# Patient Record
Sex: Female | Born: 1970 | Race: Black or African American | Hispanic: No | Marital: Single | Smoking: Never smoker
Health system: Southern US, Community
[De-identification: ages and names within clinical notes are randomized; demographics above are authoritative.]

## PROBLEM LIST (undated history)

## (undated) DIAGNOSIS — G47411 Narcolepsy with cataplexy: Secondary | ICD-10-CM

## (undated) DIAGNOSIS — Z1371 Encounter for nonprocreative screening for genetic disease carrier status: Secondary | ICD-10-CM

## (undated) DIAGNOSIS — G909 Disorder of the autonomic nervous system, unspecified: Secondary | ICD-10-CM

## (undated) DIAGNOSIS — E668 Other obesity: Secondary | ICD-10-CM

## (undated) DIAGNOSIS — Z9189 Other specified personal risk factors, not elsewhere classified: Secondary | ICD-10-CM

## (undated) DIAGNOSIS — R7303 Prediabetes: Secondary | ICD-10-CM

## (undated) DIAGNOSIS — G4733 Obstructive sleep apnea (adult) (pediatric): Secondary | ICD-10-CM

## (undated) DIAGNOSIS — J45909 Unspecified asthma, uncomplicated: Secondary | ICD-10-CM

## (undated) DIAGNOSIS — F32A Depression, unspecified: Secondary | ICD-10-CM

## (undated) DIAGNOSIS — J4540 Moderate persistent asthma, uncomplicated: Secondary | ICD-10-CM

## (undated) DIAGNOSIS — F329 Major depressive disorder, single episode, unspecified: Secondary | ICD-10-CM

## (undated) DIAGNOSIS — Z803 Family history of malignant neoplasm of breast: Secondary | ICD-10-CM

## (undated) DIAGNOSIS — B009 Herpesviral infection, unspecified: Secondary | ICD-10-CM

## (undated) HISTORY — DX: Family history of malignant neoplasm of breast: Z80.3

## (undated) HISTORY — DX: Other obesity: E66.8

## (undated) HISTORY — PX: TONSILECTOMY, ADENOIDECTOMY, BILATERAL MYRINGOTOMY AND TUBES: SHX2538

## (undated) HISTORY — DX: Depression, unspecified: F32.A

## (undated) HISTORY — PX: TUBAL LIGATION: SHX77

## (undated) HISTORY — PX: TONSILLECTOMY: SUR1361

## (undated) HISTORY — DX: Disorder of the autonomic nervous system, unspecified: G90.9

## (undated) HISTORY — DX: Narcolepsy with cataplexy: G47.411

## (undated) HISTORY — DX: Moderate persistent asthma, uncomplicated: J45.40

## (undated) HISTORY — DX: Major depressive disorder, single episode, unspecified: F32.9

## (undated) HISTORY — DX: Encounter for nonprocreative screening for genetic disease carrier status: Z13.71

## (undated) HISTORY — DX: Unspecified asthma, uncomplicated: J45.909

## (undated) HISTORY — DX: Herpesviral infection, unspecified: B00.9

## (undated) HISTORY — DX: Obstructive sleep apnea (adult) (pediatric): G47.33

## (undated) HISTORY — DX: Other specified personal risk factors, not elsewhere classified: Z91.89

---

## 2008-01-18 ENCOUNTER — Ambulatory Visit: Payer: Self-pay | Admitting: Unknown Physician Specialty

## 2009-03-18 ENCOUNTER — Emergency Department: Payer: Self-pay | Admitting: Emergency Medicine

## 2009-09-27 HISTORY — PX: BREAST BIOPSY: SHX20

## 2010-08-03 ENCOUNTER — Ambulatory Visit: Payer: Self-pay | Admitting: Unknown Physician Specialty

## 2010-08-19 ENCOUNTER — Ambulatory Visit: Payer: Self-pay | Admitting: Unknown Physician Specialty

## 2010-09-17 ENCOUNTER — Ambulatory Visit: Payer: Self-pay | Admitting: Surgery

## 2011-04-15 ENCOUNTER — Ambulatory Visit: Payer: Self-pay | Admitting: Internal Medicine

## 2011-04-16 ENCOUNTER — Ambulatory Visit: Payer: Self-pay | Admitting: Internal Medicine

## 2011-04-22 DIAGNOSIS — R739 Hyperglycemia, unspecified: Secondary | ICD-10-CM | POA: Insufficient documentation

## 2011-04-22 DIAGNOSIS — J4540 Moderate persistent asthma, uncomplicated: Secondary | ICD-10-CM

## 2011-04-22 DIAGNOSIS — E668 Other obesity: Secondary | ICD-10-CM

## 2011-04-22 DIAGNOSIS — E669 Obesity, unspecified: Secondary | ICD-10-CM

## 2011-04-22 HISTORY — DX: Moderate persistent asthma, uncomplicated: J45.40

## 2011-04-22 HISTORY — DX: Other obesity: E66.8

## 2011-04-22 HISTORY — DX: Obesity, unspecified: E66.9

## 2011-05-27 ENCOUNTER — Ambulatory Visit: Payer: Self-pay | Admitting: Obstetrics & Gynecology

## 2011-07-23 ENCOUNTER — Ambulatory Visit: Payer: Self-pay | Admitting: Obstetrics & Gynecology

## 2011-07-30 ENCOUNTER — Ambulatory Visit: Payer: Self-pay | Admitting: Obstetrics & Gynecology

## 2011-08-23 ENCOUNTER — Ambulatory Visit: Payer: Self-pay | Admitting: Unknown Physician Specialty

## 2011-09-28 DIAGNOSIS — Z9189 Other specified personal risk factors, not elsewhere classified: Secondary | ICD-10-CM

## 2011-09-28 DIAGNOSIS — Z1371 Encounter for nonprocreative screening for genetic disease carrier status: Secondary | ICD-10-CM

## 2011-09-28 HISTORY — DX: Other specified personal risk factors, not elsewhere classified: Z91.89

## 2011-09-28 HISTORY — DX: Encounter for nonprocreative screening for genetic disease carrier status: Z13.71

## 2012-08-07 ENCOUNTER — Ambulatory Visit: Payer: Self-pay | Admitting: Unknown Physician Specialty

## 2012-11-02 ENCOUNTER — Emergency Department: Payer: Self-pay | Admitting: Emergency Medicine

## 2013-01-02 DIAGNOSIS — M545 Low back pain, unspecified: Secondary | ICD-10-CM | POA: Insufficient documentation

## 2013-01-27 DIAGNOSIS — F32A Depression, unspecified: Secondary | ICD-10-CM | POA: Insufficient documentation

## 2013-01-27 DIAGNOSIS — G47411 Narcolepsy with cataplexy: Secondary | ICD-10-CM

## 2013-01-27 DIAGNOSIS — F329 Major depressive disorder, single episode, unspecified: Secondary | ICD-10-CM | POA: Insufficient documentation

## 2013-01-27 HISTORY — DX: Narcolepsy with cataplexy: G47.411

## 2013-01-27 HISTORY — DX: Depression, unspecified: F32.A

## 2013-02-09 ENCOUNTER — Encounter: Payer: Self-pay | Admitting: Orthopaedic Surgery

## 2013-02-13 ENCOUNTER — Ambulatory Visit: Payer: Self-pay | Admitting: Family Medicine

## 2013-02-25 ENCOUNTER — Encounter: Payer: Self-pay | Admitting: Orthopaedic Surgery

## 2013-03-13 ENCOUNTER — Other Ambulatory Visit: Payer: Self-pay | Admitting: *Deleted

## 2013-03-13 DIAGNOSIS — Z1239 Encounter for other screening for malignant neoplasm of breast: Secondary | ICD-10-CM

## 2013-03-15 ENCOUNTER — Ambulatory Visit: Payer: Self-pay | Admitting: Family Medicine

## 2013-03-19 ENCOUNTER — Other Ambulatory Visit: Payer: Self-pay | Admitting: Obstetrics & Gynecology

## 2013-03-19 DIAGNOSIS — Z803 Family history of malignant neoplasm of breast: Secondary | ICD-10-CM

## 2013-03-19 DIAGNOSIS — Z1239 Encounter for other screening for malignant neoplasm of breast: Secondary | ICD-10-CM

## 2013-03-29 ENCOUNTER — Ambulatory Visit
Admission: RE | Admit: 2013-03-29 | Discharge: 2013-03-29 | Disposition: A | Discharge status: Home / Self Care | Payer: BC Managed Care – PPO | Source: Ambulatory Visit | Attending: Obstetrics & Gynecology | Admitting: Obstetrics & Gynecology

## 2013-03-29 DIAGNOSIS — Z803 Family history of malignant neoplasm of breast: Secondary | ICD-10-CM

## 2013-03-29 DIAGNOSIS — Z1239 Encounter for other screening for malignant neoplasm of breast: Secondary | ICD-10-CM

## 2013-05-07 ENCOUNTER — Other Ambulatory Visit: Payer: BC Managed Care – PPO

## 2013-05-09 ENCOUNTER — Ambulatory Visit
Admission: RE | Admit: 2013-05-09 | Discharge: 2013-05-09 | Disposition: A | Discharge status: Home / Self Care | Payer: BC Managed Care – PPO | Source: Ambulatory Visit | Attending: *Deleted | Admitting: *Deleted

## 2013-05-09 DIAGNOSIS — Z1239 Encounter for other screening for malignant neoplasm of breast: Secondary | ICD-10-CM

## 2013-05-09 MED ORDER — GADOBENATE DIMEGLUMINE 529 MG/ML IV SOLN
20.0000 mL | Freq: Once | INTRAVENOUS | Status: AC | PRN
  Administered 2013-05-09: 20 mL via INTRAVENOUS

## 2013-11-01 DIAGNOSIS — M1991 Primary osteoarthritis, unspecified site: Secondary | ICD-10-CM | POA: Insufficient documentation

## 2014-04-26 ENCOUNTER — Other Ambulatory Visit: Payer: Self-pay

## 2014-04-26 DIAGNOSIS — Z1231 Encounter for screening mammogram for malignant neoplasm of breast: Secondary | ICD-10-CM

## 2014-05-09 ENCOUNTER — Ambulatory Visit: Payer: BC Managed Care – PPO

## 2014-05-10 ENCOUNTER — Other Ambulatory Visit: Payer: Self-pay | Admitting: *Deleted

## 2014-05-10 ENCOUNTER — Ambulatory Visit (INDEPENDENT_AMBULATORY_CARE_PROVIDER_SITE_OTHER): Payer: No Typology Code available for payment source

## 2014-05-10 ENCOUNTER — Ambulatory Visit (INDEPENDENT_AMBULATORY_CARE_PROVIDER_SITE_OTHER): Payer: No Typology Code available for payment source | Admitting: Podiatry

## 2014-05-10 ENCOUNTER — Encounter: Payer: Self-pay | Admitting: Podiatry

## 2014-05-10 VITALS — BP 122/79 | HR 80 | Resp 16 | Ht 67.0 in | Wt 350.0 lb

## 2014-05-10 DIAGNOSIS — M722 Plantar fascial fibromatosis: Secondary | ICD-10-CM

## 2014-05-10 MED ORDER — DICLOFENAC SODIUM 75 MG PO TBEC: 75.0000 mg | DELAYED_RELEASE_TABLET | Freq: Two times a day (BID) | ORAL | Status: AC

## 2014-05-10 MED ORDER — TRIAMCINOLONE ACETONIDE 10 MG/ML IJ SUSP
10.0000 mg | Freq: Once | INTRAMUSCULAR | Status: AC
  Administered 2014-05-10: 10 mg

## 2014-05-10 NOTE — Progress Notes (Signed)
Subjective:     Patient ID: Emma Bowman, female   DOB: 07-24-1971, 43 y.o.   MRN: [id number]  HPI patient presents stating I have a very painful left heel and I'm trying to lose weight and trying to exercise. States it's been hurting for around 6 months   Review of Systems     Objective:   Physical Exam Neurovascular status intact with muscle strength adequate and found to have exquisite discomfort left plantar heel at the insertion into the calcaneus    Assessment:     Plantar fasciitis left with inflammation and fluid buildup    Plan:     H&P and x-ray reviewed and injected the left plantar fascia 3 mg Kenalog 5 mg like Marcaine mixture and instructed on physical therapy dispensed fascially brace and placed on diclofenac 75 mg twice a day

## 2014-05-10 NOTE — Patient Instructions (Signed)

## 2014-05-23 ENCOUNTER — Ambulatory Visit: Payer: BC Managed Care – PPO

## 2014-05-24 ENCOUNTER — Ambulatory Visit: Payer: No Typology Code available for payment source | Admitting: Podiatry

## 2014-05-27 ENCOUNTER — Ambulatory Visit: Payer: BC Managed Care – PPO

## 2014-06-04 ENCOUNTER — Ambulatory Visit (INDEPENDENT_AMBULATORY_CARE_PROVIDER_SITE_OTHER): Payer: No Typology Code available for payment source | Admitting: Podiatry

## 2014-06-04 VITALS — BP 134/84 | HR 79 | Resp 16

## 2014-06-04 DIAGNOSIS — M722 Plantar fascial fibromatosis: Secondary | ICD-10-CM

## 2014-06-04 MED ORDER — TRIAMCINOLONE ACETONIDE 10 MG/ML IJ SUSP: 10.0000 mg | Freq: Once | INTRAMUSCULAR | Status: AC

## 2014-06-04 NOTE — Progress Notes (Signed)
Subjective:     Patient ID: Emma Bowman, female   DOB: Feb 14, 1971, 43 y.o.   MRN: [id number]  HPI patient states that my heel is still hurting me and I'm supposed to be active do to my weight but I'm having trouble and it is worse when I get up in the morning   Review of Systems     Objective:   Physical Exam Neurovascular status unchanged with pain more in the central and lateral band of the plantar fascial left with discomfort especially after periods of sitting    Assessment:     Plantar fasciitis still present left heel    Plan:     Reviewed condition and today from the lateral side I injected 3 mg Kenalog 5 mg Xylocaine and dispensed night splint with instructions on usage

## 2014-06-10 ENCOUNTER — Encounter (INDEPENDENT_AMBULATORY_CARE_PROVIDER_SITE_OTHER): Payer: Self-pay

## 2014-06-10 ENCOUNTER — Ambulatory Visit
Admission: RE | Admit: 2014-06-10 | Discharge: 2014-06-10 | Disposition: A | Discharge status: Home / Self Care | Payer: BC Managed Care – PPO | Source: Ambulatory Visit

## 2014-06-10 DIAGNOSIS — Z1231 Encounter for screening mammogram for malignant neoplasm of breast: Secondary | ICD-10-CM

## 2014-06-15 DIAGNOSIS — G4701 Insomnia due to medical condition: Secondary | ICD-10-CM | POA: Insufficient documentation

## 2014-06-15 DIAGNOSIS — R351 Nocturia: Secondary | ICD-10-CM | POA: Insufficient documentation

## 2014-06-15 DIAGNOSIS — G909 Disorder of the autonomic nervous system, unspecified: Secondary | ICD-10-CM

## 2014-06-15 DIAGNOSIS — G4710 Hypersomnia, unspecified: Secondary | ICD-10-CM | POA: Insufficient documentation

## 2014-06-15 HISTORY — DX: Disorder of the autonomic nervous system, unspecified: G90.9

## 2014-06-25 ENCOUNTER — Ambulatory Visit: Payer: No Typology Code available for payment source | Admitting: Podiatry

## 2014-08-13 DIAGNOSIS — G47419 Narcolepsy without cataplexy: Secondary | ICD-10-CM | POA: Insufficient documentation

## 2014-09-16 ENCOUNTER — Other Ambulatory Visit: Payer: Self-pay | Admitting: Obstetrics & Gynecology

## 2014-09-16 DIAGNOSIS — Z803 Family history of malignant neoplasm of breast: Secondary | ICD-10-CM

## 2014-09-16 DIAGNOSIS — R922 Inconclusive mammogram: Secondary | ICD-10-CM

## 2014-09-16 DIAGNOSIS — R9230 Dense breasts, unspecified: Secondary | ICD-10-CM

## 2014-10-22 ENCOUNTER — Other Ambulatory Visit: Payer: Self-pay | Admitting: Obstetrics & Gynecology

## 2014-10-22 DIAGNOSIS — Z803 Family history of malignant neoplasm of breast: Secondary | ICD-10-CM

## 2014-10-22 DIAGNOSIS — R922 Inconclusive mammogram: Secondary | ICD-10-CM

## 2015-01-02 DIAGNOSIS — N393 Stress incontinence (female) (male): Secondary | ICD-10-CM | POA: Insufficient documentation

## 2015-03-25 DIAGNOSIS — B009 Herpesviral infection, unspecified: Secondary | ICD-10-CM

## 2015-03-25 HISTORY — DX: Herpesviral infection, unspecified: B00.9

## 2015-06-09 ENCOUNTER — Other Ambulatory Visit: Payer: Self-pay

## 2015-06-09 DIAGNOSIS — Z1231 Encounter for screening mammogram for malignant neoplasm of breast: Secondary | ICD-10-CM

## 2015-07-11 DIAGNOSIS — G4733 Obstructive sleep apnea (adult) (pediatric): Secondary | ICD-10-CM

## 2015-07-11 HISTORY — DX: Obstructive sleep apnea (adult) (pediatric): G47.33

## 2015-07-14 ENCOUNTER — Ambulatory Visit: Payer: BC Managed Care – PPO

## 2015-08-05 ENCOUNTER — Ambulatory Visit
Admission: RE | Admit: 2015-08-05 | Discharge: 2015-08-05 | Disposition: A | Discharge status: Home / Self Care | Payer: BC Managed Care – PPO | Source: Ambulatory Visit

## 2015-08-05 DIAGNOSIS — Z1231 Encounter for screening mammogram for malignant neoplasm of breast: Secondary | ICD-10-CM

## 2015-08-12 ENCOUNTER — Other Ambulatory Visit: Payer: Self-pay | Admitting: Obstetrics & Gynecology

## 2015-08-12 DIAGNOSIS — N63 Unspecified lump in unspecified breast: Secondary | ICD-10-CM

## 2015-08-12 DIAGNOSIS — Z803 Family history of malignant neoplasm of breast: Secondary | ICD-10-CM

## 2015-08-14 ENCOUNTER — Ambulatory Visit: Payer: Self-pay | Admitting: Gastroenterology

## 2015-08-14 ENCOUNTER — Other Ambulatory Visit: Payer: Self-pay

## 2015-08-20 ENCOUNTER — Ambulatory Visit
Admission: RE | Admit: 2015-08-20 | Discharge: 2015-08-20 | Disposition: A | Discharge status: Home / Self Care | Payer: BC Managed Care – PPO | Source: Ambulatory Visit | Attending: Obstetrics & Gynecology | Admitting: Obstetrics & Gynecology

## 2015-08-20 ENCOUNTER — Other Ambulatory Visit: Payer: Self-pay | Admitting: Obstetrics & Gynecology

## 2015-08-20 DIAGNOSIS — N63 Unspecified lump in unspecified breast: Secondary | ICD-10-CM

## 2015-09-24 ENCOUNTER — Encounter: Payer: Self-pay | Admitting: Gastroenterology

## 2015-09-24 ENCOUNTER — Ambulatory Visit (INDEPENDENT_AMBULATORY_CARE_PROVIDER_SITE_OTHER): Payer: BC Managed Care – PPO | Admitting: Gastroenterology

## 2015-09-24 VITALS — BP 128/79 | HR 77 | Temp 97.9°F | Ht 67.0 in | Wt 335.0 lb

## 2015-09-24 DIAGNOSIS — K219 Gastro-esophageal reflux disease without esophagitis: Secondary | ICD-10-CM

## 2015-09-24 DIAGNOSIS — K5909 Other constipation: Secondary | ICD-10-CM | POA: Diagnosis not present

## 2015-09-24 NOTE — Progress Notes (Signed)
Gastroenterology Consultation  Referring Provider:     Hortencia Pilar, MD Primary Care Physician:  Hortencia Pilar, MD Primary Gastroenterologist:  Dr. Allen Norris     Reason for Consultation:     Constipation        HPI:   Emma Bowman is a 44 y.o. y/o female referred for consultation & management of constipation by Dr. Hortencia Pilar, MD.  This patient comes today with a report of constipation. The patient states that she has constipation on a regular basis and has to take medication for it. The patient reports that she takes MiraLAX and multiple over-the-counter medications for her constipation. She denies any unexplained weight loss. She also reports that she had a colonoscopy and EGD back in 2013 by Dr. Vira Agar. At that time it was done for the constipation and for heartburn. The patient had biopsies of the esophagus that was consistent with reflux. The patient reports that her omeprazole 40 mg a day is not taking care of her heartburn. The patient also reports that the constipation is her pain in her lower back. No report of any black stools or bloody stools. The pathology from her last procedure showed some mild gastritis reflux but did not have any pathology from the colon to suggest any colon polyps. The patient also states that she does not believe that there were any polyps found during her colonoscopy.  Past Medical History  Diagnosis Date  . Asthma   . Depression   . Asthma, moderate persistent 04/22/2011  . Obstructive apnea 07/11/2015  . ANS (autonomic nervous system) disease 06/15/2014    Overview:  Please refer to the documentation related to her overnight sleep procedure on  04/24/2014 and at the [facility]  [id number]): -see detailed sleep report illustrating the contribution of her nighttime variability in heart rate to the fragmentation of her sleep process - we recommend a trial of auto-PAP to control the mild OSA, mostly REM-sleep dependent prior to consi  .  Clinical depression 01/27/2013  . Extreme obesity ([place]) 04/22/2011  . 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 Herpes simplex type 1 infection 03/25/2015    Past Surgical History  Procedure Laterality Date  . Tubal ligation    . Tonsilectomy, adenoidectomy, bilateral myringotomy and tubes      Prior to Admission medications   Medication Sig Start Date End Date Taking? Authorizing Provider  albuterol (PROVENTIL) (2.5 MG/3ML) 0.083% nebulizer solution Inhale into the lungs. 02/13/13  Yes Historical Provider, MD  antipyrine-benzocaine Toniann Fail) otic solution  02/15/14  Yes Historical Provider, MD  beclomethasone (QVAR) 80 MCG/ACT inhaler  11/06/13  Yes Historical Provider, MD  chlorhexidine (PERIDEX) 0.12 % solution SWISH AND SPIT 15 MLS 2 (TWO) TIMES DAILY. 07/24/15  Yes Historical Provider, MD  CONTRAVE 8-90 MG TB12  08/11/15  Yes Historical Provider, MD  diclofenac (VOLTAREN) 75 MG EC tablet Take 1 tablet (75 mg total) by mouth 2 (two) times daily. 05/10/14  Yes Wallene Huh, DPM  diclofenac sodium (VOLTAREN) 1 % GEL  10/31/13  Yes Historical Provider, MD  modafinil (PROVIGIL) 200 MG tablet Take by mouth. 07/11/15 07/10/16 Yes Historical Provider, MD  montelukast (SINGULAIR) 10 MG tablet TAKE 1 TABLET (10 MG TOTAL) BY MOUTH NIGHTLY. 07/15/15  Yes Historical Provider, MD  neomycin-polymyxin-hydrocortisone (CORTISPORIN) 3.5-10000-1 otic suspension PLACE 3 DROPS INTO BOTH EARS 4 (FOUR) TIMES DAILY. USE IN AFFECTED EAR. 05/29/15  Yes Historical Provider, MD  nystatin (MYCOSTATIN) 100000 UNIT/ML suspension SWISH AND SWALLOW 5 MLS  4 (FOUR) TIMES DAILY. 07/28/15 07/01/16 Yes Historical Provider, MD  omeprazole (PRILOSEC) 40 MG capsule  11/22/13  Yes Historical Provider, MD  traMADol (ULTRAM) 50 MG tablet  11/05/13  Yes Historical Provider, MD  amphetamine-dextroamphetamine (ADDERALL XR) 30 MG 24 hr capsule Reported on 09/24/2015 11/29/13   Historical Provider, MD  DULoxetine (CYMBALTA) 30 MG capsule Take by mouth.  03/22/14 03/22/15  Historical Provider, MD  fluticasone Asencion Islam) 50 MCG/ACT nasal spray Reported on 09/24/2015 11/29/13   Historical Provider, MD  meloxicam (MOBIC) 15 MG tablet Reported on 09/24/2015 11/29/13   Historical Provider, MD  montelukast (SINGULAIR) 10 MG tablet Take by mouth. 02/15/14 02/15/15  Historical Provider, MD  oxybutynin (DITROPAN) 5 MG tablet Take by mouth. Reported on 09/24/2015 01/01/15 01/01/16  Historical Provider, MD  traZODone (DESYREL) 100 MG tablet Take by mouth. 05/09/14 05/10/15  Historical Provider, MD    Family History  Problem Relation Age of Onset  . Diabetes Mother   . Diverticulitis Father      Social History  Substance Use Topics  . Smoking status: Never Smoker   . Smokeless tobacco: Never Used  . Alcohol Use: 0.0 oz/week    0 Standard drinks or equivalent per week    Allergies as of 09/24/2015  . (No Known Allergies)    Review of Systems:    All systems reviewed and negative except where noted in HPI.   Physical Exam:  BP 128/79 mmHg  Pulse 77  Temp(Src) 97.9 F (36.6 C) (Oral)  Ht 5\' 7"  (1.702 m)  Wt 335 lb (151.955 kg)  BMI 52.46 kg/m2 No LMP recorded. Psych:  Alert and cooperative. Normal mood and affect. General:   Alert,  Well-developed, morbidly obese, well-nourished, pleasant and cooperative in NAD Head:  Normocephalic and atraumatic. Eyes:  Sclera clear, no icterus.   Conjunctiva pink. Ears:  Normal auditory acuity. Nose:  No deformity, discharge, or lesions. Mouth:  No deformity or lesions,oropharynx pink & moist. Neck:  Supple; no masses or thyromegaly. Lungs:  Respirations even and unlabored.  Clear throughout to auscultation.   No wheezes, crackles, or rhonchi. No acute distress. Heart:  Regular rate and rhythm; no murmurs, clicks, rubs, or gallops. Abdomen:  Normal bowel sounds.  No bruits.  Soft, non-tender anddistended without masses, hepatosplenomegaly or hernias noted.  No guarding or rebound tenderness.  Negative Carnett  sign.   Rectal:  Deferred.  Msk:  Symmetrical without gross deformities.  Good, equal movement & strength bilaterally. Pulses:  Normal pulses noted. Extremities:  No clubbing or edema.  No cyanosis. Neurologic:  Alert and oriented x3;  grossly normal neurologically. Skin:  Intact without significant lesions or rashes.  No jaundice. Lymph Nodes:  No significant cervical adenopathy. Psych:  Alert and cooperative. Normal mood and affect.  Imaging Studies: No results found.  Assessment and Plan:   Emma Bowman is a 44 y.o. y/o female comes today with a history of chronic constipation. She also reports that she is having reflux that has not helped with her present omeprazole treatment. The patient will be started on 145 mg of Linzess and will also be started on a trial of Dexilant 60 mg per day. The patient will contact me with the results of these medications and she will then be given a prescription for the medication if they work. The patient has been explained the plan and agrees with it.   Note: This dictation was prepared with Dragon dictation along with smaller phrase technology. Any transcriptional errors that  result from this process are unintentional.

## 2016-09-30 ENCOUNTER — Other Ambulatory Visit: Payer: Self-pay | Admitting: Family Medicine

## 2016-09-30 DIAGNOSIS — Z1231 Encounter for screening mammogram for malignant neoplasm of breast: Secondary | ICD-10-CM

## 2016-10-01 ENCOUNTER — Ambulatory Visit
Admission: RE | Admit: 2016-10-01 | Discharge: 2016-10-01 | Disposition: A | Discharge status: Home / Self Care | Payer: BC Managed Care – PPO | Source: Ambulatory Visit | Attending: Family Medicine | Admitting: Family Medicine

## 2016-10-01 DIAGNOSIS — Z1231 Encounter for screening mammogram for malignant neoplasm of breast: Secondary | ICD-10-CM

## 2017-01-12 ENCOUNTER — Other Ambulatory Visit: Payer: No Typology Code available for payment source

## 2017-01-12 ENCOUNTER — Ambulatory Visit: Payer: No Typology Code available for payment source | Admitting: Obstetrics & Gynecology

## 2017-02-04 ENCOUNTER — Other Ambulatory Visit: Payer: Self-pay | Admitting: Obstetrics & Gynecology

## 2017-02-04 DIAGNOSIS — D259 Leiomyoma of uterus, unspecified: Secondary | ICD-10-CM

## 2017-02-08 ENCOUNTER — Ambulatory Visit (INDEPENDENT_AMBULATORY_CARE_PROVIDER_SITE_OTHER): Payer: BC Managed Care – PPO

## 2017-02-08 ENCOUNTER — Encounter: Payer: Self-pay | Admitting: Obstetrics & Gynecology

## 2017-02-08 ENCOUNTER — Ambulatory Visit (INDEPENDENT_AMBULATORY_CARE_PROVIDER_SITE_OTHER): Payer: BC Managed Care – PPO | Admitting: Obstetrics & Gynecology

## 2017-02-08 VITALS — BP 130/90 | HR 76 | Ht 67.0 in | Wt 350.0 lb

## 2017-02-08 DIAGNOSIS — D259 Leiomyoma of uterus, unspecified: Secondary | ICD-10-CM

## 2017-02-08 DIAGNOSIS — R1032 Left lower quadrant pain: Secondary | ICD-10-CM | POA: Diagnosis not present

## 2017-02-08 NOTE — Progress Notes (Signed)
  HPI: Pt returns with left sided pain, mild and intermitant.  [place]  3 mos ago w RIGHT sided mass (tube or uterus fibroid, unclear origin).  Ultrasound demonstrates no masses seen These findings are Pelvis resolved mass on right side, so not a fibroid.  No new lesions.  PMHx: She  has a past medical history of ANS (autonomic nervous system) disease (06/15/2014); Asthma; Asthma, moderate persistent (04/22/2011); Cataplexy and narcolepsy (01/27/2013); Clinical depression (01/27/2013); Depression; Extreme obesity (04/22/2011); Herpes simplex type 1 infection (03/25/2015); and Obstructive apnea (07/11/2015). Also,  has a past surgical history that includes Tubal ligation and Tonsilectomy, adenoidectomy, bilateral myringotomy and tubes., family history includes Diabetes in her mother; Diverticulitis in her father.,  reports that she has never smoked. She has never used smokeless tobacco. She reports that she drinks alcohol. She reports that she does not use drugs.  She has a current medication list which includes the following prescription(s): albuterol, beclomethasone, chlorhexidine, diclofenac, fluticasone, ipratropium, omeprazole, tramadol, amphetamine-dextroamphetamine, antipyrine-benzocaine, contrave, diclofenac sodium, duloxetine, meloxicam, modafinil, montelukast, montelukast, neomycin-polymyxin-hydrocortisone, trazodone, triamcinolone, and vitamin d (ergocalciferol), and the following Facility-Administered Medications: triamcinolone acetonide. Also, has No Known Allergies.  Review of Systems  Constitutional: Negative for chills, fever and malaise/fatigue.  HENT: Negative for congestion, sinus pain and sore throat.   Eyes: Negative for blurred vision and pain.  Respiratory: Negative for cough and wheezing.   Cardiovascular: Negative for chest pain and leg swelling.  Gastrointestinal: Negative for abdominal pain, constipation, diarrhea, heartburn, nausea and vomiting.  Genitourinary: Negative for dysuria,  frequency, hematuria and urgency.  Musculoskeletal: Negative for back pain, joint pain, myalgias and neck pain.  Skin: Negative for itching and rash.  Neurological: Negative for dizziness, tremors and weakness.  Endo/Heme/Allergies: Does not bruise/bleed easily.  Psychiatric/Behavioral: Negative for depression. The patient is not nervous/anxious and does not have insomnia.     Objective: BP 130/90   Pulse 76   Ht 5\' 7"  (1.702 m)   Wt (!) 350 lb (158.8 kg)   LMP 01/27/2017   BMI 54.82 kg/m   Physical examination Constitutional NAD, Conversant  Skin No rashes, lesions or ulceration.   Extremities: Moves all appropriately.  Normal ROM for age. No lymphadenopathy.  Neuro: Grossly intact  Psych: Oriented to PPT.  Normal mood. Normal affect.   Assessment:  Left lower quadrant pain GI f/u as scheduled No further GYN interventions.  Do not think Dx Lap will help ellucidate pain etiology without risk.  Spine MRI also a possibility. Pain clinic.  Pain mild and she does not wish to aggressively pursue at this time.  Barnett Applebaum, MD, [facility], [facility] 02/08/2017  5:11 PM

## 2017-04-27 ENCOUNTER — Encounter: Payer: Self-pay | Admitting: *Deleted

## 2017-04-27 ENCOUNTER — Emergency Department: Payer: BC Managed Care – PPO

## 2017-04-27 ENCOUNTER — Emergency Department
Admission: EM | Admit: 2017-04-27 | Discharge: 2017-04-27 | Disposition: A | Discharge status: Home / Self Care | Payer: BC Managed Care – PPO | Attending: Emergency Medicine | Admitting: Emergency Medicine

## 2017-04-27 DIAGNOSIS — R55 Syncope and collapse: Secondary | ICD-10-CM | POA: Diagnosis not present

## 2017-04-27 DIAGNOSIS — R51 Headache: Secondary | ICD-10-CM | POA: Diagnosis present

## 2017-04-27 DIAGNOSIS — G44209 Tension-type headache, unspecified, not intractable: Secondary | ICD-10-CM | POA: Insufficient documentation

## 2017-04-27 DIAGNOSIS — R1084 Generalized abdominal pain: Secondary | ICD-10-CM | POA: Insufficient documentation

## 2017-04-27 DIAGNOSIS — J45909 Unspecified asthma, uncomplicated: Secondary | ICD-10-CM | POA: Insufficient documentation

## 2017-04-27 LAB — COMPREHENSIVE METABOLIC PANEL
ALK PHOS: 36 U/L — AB (ref 38–126)
ALT: 13 U/L — AB (ref 14–54)
AST: 17 U/L (ref 15–41)
Albumin: 3.7 g/dL (ref 3.5–5.0)
Anion gap: 7 (ref 5–15)
BILIRUBIN TOTAL: 0.7 mg/dL (ref 0.3–1.2)
BUN: 12 mg/dL (ref 6–20)
CHLORIDE: 105 mmol/L (ref 101–111)
CO2: 26 mmol/L (ref 22–32)
CREATININE: 0.72 mg/dL (ref 0.44–1.00)
Calcium: 8.8 mg/dL — ABNORMAL LOW (ref 8.9–10.3)
GFR calc Af Amer: >60 mL/min (ref >60)
Glucose, Bld: 102 mg/dL — ABNORMAL HIGH (ref 65–99)
Potassium: 3.8 mmol/L (ref 3.5–5.1)
Sodium: 138 mmol/L (ref 135–145)
Total Protein: 6.6 g/dL (ref 6.5–8.1)

## 2017-04-27 LAB — CBC
HCT: 39.3 % (ref 35.0–47.0)
Hemoglobin: 13.1 g/dL (ref 12.0–16.0)
MCH: 29.7 pg (ref 26.0–34.0)
MCHC: 33.3 g/dL (ref 32.0–36.0)
MCV: 89.4 fL (ref 80.0–100.0)
PLATELETS: 310 10*3/uL (ref 150–440)
RBC: 4.4 MIL/uL (ref 3.80–5.20)
RDW: 15.3 % — AB (ref 11.5–14.5)
WBC: 6.1 10*3/uL (ref 3.6–11.0)

## 2017-04-27 LAB — LIPASE, BLOOD: LIPASE: 19 U/L (ref 11–51)

## 2017-04-27 MED ORDER — KETOROLAC TROMETHAMINE 30 MG/ML IJ SOLN
30.0000 mg | Freq: Once | INTRAMUSCULAR | Status: AC
  Administered 2017-04-27: 30 mg via INTRAVENOUS
  Filled 2017-04-27: qty 1

## 2017-04-27 MED ORDER — DIPHENHYDRAMINE HCL 50 MG/ML IJ SOLN
25.0000 mg | Freq: Once | INTRAMUSCULAR | Status: AC
  Administered 2017-04-27: 25 mg via INTRAVENOUS
  Filled 2017-04-27: qty 1

## 2017-04-27 MED ORDER — METOCLOPRAMIDE HCL 5 MG/ML IJ SOLN
10.0000 mg | Freq: Once | INTRAMUSCULAR | Status: AC
  Administered 2017-04-27: 10 mg via INTRAVENOUS
  Filled 2017-04-27: qty 2

## 2017-04-27 MED ORDER — BUTALBITAL-APAP-CAFFEINE 50-325-40 MG PO TABS: 1.0000 | ORAL_TABLET | Freq: Four times a day (QID) | ORAL | 0 refills | Status: AC | PRN

## 2017-04-27 NOTE — ED Triage Notes (Signed)
Pt arrived to ED from [place] via EMS after a near syncopal episode. Pt reports having 8/10 head pain that started around the time of the near syncopal episode and 4/10 abd pain that started over 2 weeks ago.  Pt had 1 episode of vomiting with EMS. EMS administered 4 mg of Zofran. Pt also started Victoza today to help with weight loss, pt is not a diabetic. Pt also reports having has one episode of diarrhea yesterday. No fevers reported. No slurred speech, facial droop, or neuro deficits noted. Pt able to stand and pivot from stretcher to  Bed and is alert and oriented x 4.

## 2017-04-27 NOTE — ED Provider Notes (Signed)
[facility] Emergency Department Provider Note       Time seen: ----------------------------------------- 8:17 PM on 04/27/2017 -----------------------------------------     I have reviewed the triage vital signs and the nursing notes.   HISTORY   Chief Complaint Abdominal Pain and Headache    HPI Emma Bowman is a 46 y.o. female who presents to the ED for near-syncope. Patient reports having 8 out of 10 headache that started around the time that her syncopal event. Currently she is having 4 out of 10 abdominal pain that started over 2 weeks ago. She sent episode of vomiting with EMS. EMS administered Zofran and fluids she reports some improvement in her symptoms. Patient also started the toes that today to help with her weight loss. She has had one episode of diarrhea yesterday.   Past Medical History:  Diagnosis Date  . 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 Asthma   . Asthma, moderate persistent 04/22/2011  . 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 Clinical depression 01/27/2013  . Depression   . Extreme obesity 04/22/2011  . Herpes simplex type 1 infection 03/25/2015  . Obstructive apnea 07/11/2015    Patient Active Problem List   Diagnosis Date Noted  . Obstructive apnea 07/11/2015  . Herpes simplex type 1 infection 03/25/2015  . Female genuine stress incontinence 01/02/2015  . Narcolepsy without cataplexy(347.00) 08/13/2014  . 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 Insomnia due to medical condition 06/15/2014  . Excessive urination at night 06/15/2014  . Idiopathic localized osteoarthropathy 11/01/2013  . Clinical depression 01/27/2013  . 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 LBP (low back pain) 01/02/2013  . Asthma, moderate persistent 04/22/2011  . Blood glucose elevated 04/22/2011  . Extreme obesity 04/22/2011  . Morbid obesity ([place]) 04/22/2011    Past Surgical History:  Procedure Laterality Date  . TONSILECTOMY, ADENOIDECTOMY, BILATERAL MYRINGOTOMY AND TUBES    . TUBAL LIGATION      Allergies Patient has no known allergies.  Social History Social History  Substance Use Topics  . Smoking status: Never Smoker  . Smokeless tobacco: Never Used  . Alcohol use 0.0 oz/week    Review of Systems Constitutional: Negative for fever. Cardiovascular: Negative for chest pain. Respiratory: Negative for shortness of breath. Gastrointestinal: Negative for abdominal pain, vomiting and diarrhea. Genitourinary: Negative for dysuria. Musculoskeletal: Negative for back pain. Skin: Negative for rash. Neurological: Positive for headache, Positive for generalized weakness  All systems negative/normal/unremarkable except as stated in the HPI  ____________________________________________   PHYSICAL EXAM:  VITAL SIGNS: ED Triage Vitals  Enc Vitals Group     BP 04/27/17 1944 (!) 145/93     Pulse Rate 04/27/17 1944 67     Resp 04/27/17 1944 10     Temp 04/27/17 1944 98.5 F (36.9 C)     Temp Source 04/27/17 1944 Oral     SpO2 04/27/17 1944 100 %     Weight 04/27/17 1940 (!) 340 lb (154.2 kg)     Height 04/27/17 1940 5\' 7"  (1.702  m)     Head Circumference --      Peak Flow --      Pain Score 04/27/17 1940 8     Pain Loc --      Pain Edu? --      Excl. in [place]? --    Constitutional: Alert and oriented. Well appearing and in no distress. Eyes: Conjunctivae are normal. Normal extraocular movements. ENT   Head:  Normocephalic and atraumatic.   Nose: No congestion/rhinnorhea.   Mouth/Throat: Mucous membranes are moist.   Neck: No stridor. Cardiovascular: Normal rate, regular rhythm. No murmurs, rubs, or gallops. Respiratory: Normal respiratory effort without tachypnea nor retractions. Breath sounds are clear and equal bilaterally. No wheezes/rales/rhonchi. Gastrointestinal: Soft and nontender. Normal bowel sounds Musculoskeletal: Nontender with normal range of motion in extremities. No lower extremity tenderness nor edema. Neurologic:  Normal speech and language. No gross focal neurologic deficits are appreciated.Strength and sensation appear to be normal  Skin:  Skin is warm, dry and intact. No rash noted. Psychiatric: Mood and affect are normal. Speech and behavior are normal.  ____________________________________________  EKG: Interpreted by me. Sinus rhythm rate of 61 bpm, normal PR interval, normal QRS, normal QT.  ____________________________________________  ED COURSE:  Pertinent labs & imaging results that were available during my care of the patient were reviewed by me and considered in my medical decision making (see chart for details). Patient presents for headache and near syncope with abdominal pain, we will assess with labs and imaging as indicated. Clinical Course as of Apr 27 2133  Wed Apr 27, 2017  2134 Patient reports she is feeling better, will prescribe headache medicines for home.  [JW]    Clinical Course User Index [JW] Earleen Newport, MD   Procedures ____________________________________________   LABS (pertinent positives/negatives)  Labs Reviewed  COMPREHENSIVE METABOLIC PANEL - Abnormal; Notable for the following:       Result Value   Glucose, Bld 102 (*)    Calcium 8.8 (*)    ALT 13 (*)    Alkaline Phosphatase 36 (*)    All other components within normal limits  CBC - Abnormal; Notable for the following:    RDW 15.3 (*)    All other  components within normal limits  LIPASE, BLOOD  URINALYSIS, COMPLETE (UACMP) WITH MICROSCOPIC    RADIOLOGY Images were viewed by me  CT head IMPRESSION: No acute intracranial pathology. ____________________________________________  FINAL ASSESSMENT AND PLAN  Headache, near syncope, abdominal pain  Plan: Patient's labs and imaging were dictated above. Patient had presented for multiple symptoms. Advised that her Victoza may be causing some of her symptoms. She currently is feeling better and I will prescribe headache medication for her. She is stable for outpatient follow-up with her doctor.   Earleen Newport, MD   Note: This note was generated in part or whole with voice recognition software. Voice recognition is usually quite accurate but there are transcription errors that can and very often do occur. I apologize for any typographical errors that were not detected and corrected.     Earleen Newport, MD 04/27/17 2135

## 2017-04-27 NOTE — ED Notes (Signed)
Pt ambulatory out of ED and in NAD at time of leaving ED. Pt was offered a wheelchair but denied.

## 2017-08-17 ENCOUNTER — Encounter: Payer: Self-pay | Admitting: *Deleted

## 2017-08-22 ENCOUNTER — Ambulatory Visit
Admission: RE | Admit: 2017-08-22 | Discharge: 2017-08-22 | Disposition: A | Discharge status: Home / Self Care | Payer: BC Managed Care – PPO | Source: Ambulatory Visit | Attending: Unknown Physician Specialty | Admitting: Unknown Physician Specialty

## 2017-08-22 ENCOUNTER — Encounter
Admission: RE | Disposition: A | Discharge status: Home / Self Care | Payer: Self-pay | Source: Ambulatory Visit | Attending: Unknown Physician Specialty

## 2017-08-22 ENCOUNTER — Ambulatory Visit: Payer: BC Managed Care – PPO | Admitting: Anesthesiology

## 2017-08-22 ENCOUNTER — Encounter: Payer: Self-pay | Admitting: *Deleted

## 2017-08-22 DIAGNOSIS — F329 Major depressive disorder, single episode, unspecified: Secondary | ICD-10-CM | POA: Diagnosis not present

## 2017-08-22 DIAGNOSIS — Z791 Long term (current) use of non-steroidal anti-inflammatories (NSAID): Secondary | ICD-10-CM | POA: Insufficient documentation

## 2017-08-22 DIAGNOSIS — Z8371 Family history of colonic polyps: Secondary | ICD-10-CM | POA: Insufficient documentation

## 2017-08-22 DIAGNOSIS — Z6841 Body Mass Index (BMI) 40.0 and over, adult: Secondary | ICD-10-CM | POA: Diagnosis not present

## 2017-08-22 DIAGNOSIS — Z1211 Encounter for screening for malignant neoplasm of colon: Secondary | ICD-10-CM | POA: Insufficient documentation

## 2017-08-22 DIAGNOSIS — K640 First degree hemorrhoids: Secondary | ICD-10-CM | POA: Insufficient documentation

## 2017-08-22 DIAGNOSIS — Z79899 Other long term (current) drug therapy: Secondary | ICD-10-CM | POA: Insufficient documentation

## 2017-08-22 DIAGNOSIS — Z7951 Long term (current) use of inhaled steroids: Secondary | ICD-10-CM | POA: Diagnosis not present

## 2017-08-22 DIAGNOSIS — R12 Heartburn: Secondary | ICD-10-CM | POA: Diagnosis present

## 2017-08-22 DIAGNOSIS — K635 Polyp of colon: Secondary | ICD-10-CM | POA: Diagnosis not present

## 2017-08-22 DIAGNOSIS — Z7984 Long term (current) use of oral hypoglycemic drugs: Secondary | ICD-10-CM | POA: Diagnosis not present

## 2017-08-22 DIAGNOSIS — G4733 Obstructive sleep apnea (adult) (pediatric): Secondary | ICD-10-CM | POA: Insufficient documentation

## 2017-08-22 DIAGNOSIS — G47411 Narcolepsy with cataplexy: Secondary | ICD-10-CM | POA: Diagnosis not present

## 2017-08-22 DIAGNOSIS — J4540 Moderate persistent asthma, uncomplicated: Secondary | ICD-10-CM | POA: Insufficient documentation

## 2017-08-22 DIAGNOSIS — K210 Gastro-esophageal reflux disease with esophagitis: Secondary | ICD-10-CM | POA: Insufficient documentation

## 2017-08-22 HISTORY — DX: Prediabetes: R73.03

## 2017-08-22 HISTORY — PX: ESOPHAGOGASTRODUODENOSCOPY (EGD) WITH PROPOFOL: SHX5813

## 2017-08-22 HISTORY — PX: COLONOSCOPY WITH PROPOFOL: SHX5780

## 2017-08-22 LAB — POCT PREGNANCY, URINE: Preg Test, Ur: NEGATIVE

## 2017-08-22 SURGERY — ESOPHAGOGASTRODUODENOSCOPY (EGD) WITH PROPOFOL
Anesthesia: General

## 2017-08-22 MED ORDER — PROPOFOL 500 MG/50ML IV EMUL
INTRAVENOUS | Status: DC | PRN
  Administered 2017-08-22: 75 ug/kg/min via INTRAVENOUS

## 2017-08-22 MED ORDER — GLYCOPYRROLATE 0.2 MG/ML IJ SOLN
INTRAMUSCULAR | Status: AC
  Filled 2017-08-22: qty 1

## 2017-08-22 MED ORDER — FENTANYL CITRATE (PF) 100 MCG/2ML IJ SOLN
INTRAMUSCULAR | Status: AC
  Filled 2017-08-22: qty 2

## 2017-08-22 MED ORDER — MIDAZOLAM HCL 2 MG/2ML IJ SOLN
INTRAMUSCULAR | Status: AC
Start: 2017-08-22 — End: ?
  Filled 2017-08-22: qty 2

## 2017-08-22 MED ORDER — LIDOCAINE HCL (PF) 2 % IJ SOLN
INTRAMUSCULAR | Status: DC | PRN
  Administered 2017-08-22: 100 mg

## 2017-08-22 MED ORDER — SODIUM CHLORIDE 0.9 % IV SOLN: INTRAVENOUS | Status: DC

## 2017-08-22 MED ORDER — GLYCOPYRROLATE 0.2 MG/ML IJ SOLN
INTRAMUSCULAR | Status: DC | PRN
  Administered 2017-08-22: 0.2 mg via INTRAVENOUS

## 2017-08-22 MED ORDER — LIDOCAINE HCL (PF) 2 % IJ SOLN
INTRAMUSCULAR | Status: AC
  Filled 2017-08-22: qty 10

## 2017-08-22 MED ORDER — SODIUM CHLORIDE 0.9 % IV SOLN
INTRAVENOUS | Status: DC
  Administered 2017-08-22: 1000 mL via INTRAVENOUS
  Administered 2017-08-22: 14:00:00 via INTRAVENOUS

## 2017-08-22 MED ORDER — PROPOFOL 10 MG/ML IV BOLUS
INTRAVENOUS | Status: DC | PRN
  Administered 2017-08-22: 20 mg via INTRAVENOUS
  Administered 2017-08-22: 10 mg via INTRAVENOUS
  Administered 2017-08-22: 20 mg via INTRAVENOUS

## 2017-08-22 MED ORDER — MIDAZOLAM HCL 5 MG/5ML IJ SOLN
INTRAMUSCULAR | Status: DC | PRN
  Administered 2017-08-22: 2 mg via INTRAVENOUS

## 2017-08-22 MED ORDER — FENTANYL CITRATE (PF) 100 MCG/2ML IJ SOLN
INTRAMUSCULAR | Status: DC | PRN
  Administered 2017-08-22 (×2): 50 ug via INTRAVENOUS

## 2017-08-22 NOTE — H&P (Signed)
Primary Care Physician:  Hortencia Pilar, MD Primary Gastroenterologist:  Dr. Vira Agar  Pre-Procedure History & Physical: HPI:  Emma Bowman is a 46 y.o. female is here for an endoscopy and colonoscopy.   Past Medical History:  Diagnosis Date  . ANS (autonomic nervous system) disease 06/15/2014   Overview:  Please refer to the documentation related to her overnight sleep procedure on  04/24/2014 and at the [facility]  [phone]): -see detailed sleep report illustrating the contribution of her nighttime variability in heart rate to the fragmentation of her sleep process - we recommend a trial of auto-PAP to control the mild OSA, mostly REM-sleep dependent prior to consi  . Asthma   . Asthma, moderate persistent 04/22/2011  . 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 Clinical depression 01/27/2013  . Depression   . Extreme obesity 04/22/2011  . Herpes simplex type 1 infection 03/25/2015  . Obstructive apnea 07/11/2015  . Pre-diabetes     Past Surgical History:  Procedure Laterality Date  . TONSILECTOMY, ADENOIDECTOMY, BILATERAL MYRINGOTOMY AND TUBES    . TONSILLECTOMY    . TUBAL LIGATION      Prior to Admission medications   Medication Sig Start Date End Date Taking? Authorizing Provider  albuterol (PROVENTIL) (2.5 MG/3ML) 0.083% nebulizer solution Inhale into the lungs. 02/13/13  Yes [provider]  amphetamine-dextroamphetamine (ADDERALL XR) 30 MG 24 hr capsule Reported on 09/24/2015 11/29/13  Yes [provider]  antipyrine-benzocaine Toniann Fail) otic solution  02/15/14  Yes [provider]  beclomethasone (QVAR) 80 MCG/ACT inhaler  11/06/13  Yes [provider]  butalbital-acetaminophen-caffeine (FIORICET, ESGIC) 50-325-40 MG tablet Take 1-2 tablets by mouth every 6 (six) hours as needed for headache. 04/27/17 04/27/18 Yes Earleen Newport, MD  chlorhexidine (PERIDEX) 0.12 % solution SWISH AND SPIT 15 MLS 2 (TWO) TIMES DAILY. 07/24/15   Yes [provider]  diclofenac (VOLTAREN) 75 MG EC tablet Take 1 tablet (75 mg total) by mouth 2 (two) times daily. 05/10/14  Yes Wallene Huh, DPM  diclofenac sodium (VOLTAREN) 1 % GEL  10/31/13  Yes [provider]  fluticasone (FLONASE) 50 MCG/ACT nasal spray Reported on 09/24/2015 11/29/13  Yes [provider]  montelukast (SINGULAIR) 10 MG tablet TAKE 1 TABLET (10 MG TOTAL) BY MOUTH NIGHTLY. 07/15/15  Yes [provider]  neomycin-polymyxin-hydrocortisone (CORTISPORIN) 3.5-10000-1 otic suspension PLACE 3 DROPS INTO BOTH EARS 4 (FOUR) TIMES DAILY. USE IN AFFECTED EAR. 05/29/15  Yes [provider]  omeprazole (PRILOSEC) 40 MG capsule  11/22/13  Yes [provider]  traMADol (ULTRAM) 50 MG tablet  11/05/13  Yes [provider]  triamcinolone (KENALOG) 0.1 % paste  02/04/17  Yes [provider]  Vitamin D, Ergocalciferol, (DRISDOL) 50000 units CAPS capsule TAKE 1 CAPSULE (50,000 UNITS TOTAL) BY MOUTH ONCE A WEEK. 01/12/17  Yes [provider]  CONTRAVE 8-90 MG TB12  08/11/15   [provider]  DULoxetine (CYMBALTA) 30 MG capsule Take by mouth. 03/22/14 03/22/15  [provider]  ipratropium (ATROVENT) 0.06 % nasal spray Place into the nose. 02/04/17 02/18/17  [provider]  meloxicam (MOBIC) 15 MG tablet Reported on 09/24/2015 11/29/13   [provider]  metFORMIN (GLUCOPHAGE) 500 MG tablet Take 500 mg by mouth 2 (two) times daily with a meal.    [provider]  modafinil (PROVIGIL) 200 MG tablet Take by mouth. 07/11/15 07/10/16  [provider]  montelukast (SINGULAIR) 10 MG tablet Take by mouth. 02/15/14 02/15/15  [provider]  traZODone (DESYREL) 100 MG tablet Take by mouth. 05/09/14 05/10/15  [provider]    Allergies as of 05/27/2017  . (No Known Allergies)    Family History  Problem Relation Age of Onset  . Diabetes Mother   .  Diverticulitis Father     Social History   Socioeconomic History  . Marital status: Single    Spouse name: Not on file  . Number of children: Not on file  . Years of education: Not on file  . Highest education level: Not on file  Social Needs  . Financial resource strain: Not on file  . Food insecurity - worry: Not on file  . Food insecurity - inability: Not on file  . Transportation needs - medical: Not on file  . Transportation needs - non-medical: Not on file  Occupational History  . Not on file  Tobacco Use  . Smoking status: Never Smoker  . Smokeless tobacco: Never Used  Substance and Sexual Activity  . Alcohol use: Yes    Alcohol/week: 0.0 oz  . Drug use: No  . Sexual activity: No    Birth control/protection: None, Surgical  Other Topics Concern  . Not on file  Social History Narrative  . Not on file    Review of Systems: See HPI, otherwise negative ROS  Physical Exam: BP (!) 145/106   Pulse 77   Temp 98.9 F (37.2 C) (Tympanic)   Resp 16   Ht 5\' 7"  (1.702 m)   Wt (!) 149.7 kg (330 lb)   LMP 08/21/2017 Comment: pregnancy test neg.  SpO2 100%   BMI 51.69 kg/m  General:   Alert,  pleasant and cooperative in NAD Head:  Normocephalic and atraumatic. Neck:  Supple; no masses or thyromegaly. Lungs:  Clear throughout to auscultation.    Heart:  Regular rate and rhythm. Abdomen:  Soft, nontender and nondistended. Normal bowel sounds, without guarding, and without rebound.   Neurologic:  Alert and  oriented x4;  grossly normal neurologically.  Impression/Plan: Emma Bowman is here for an endoscopy and colonoscopy to be performed for FH colon polyps, GERD.  Risks, benefits, limitations, and alternatives regarding  endoscopy and colonoscopy have been reviewed with the patient.  Questions have been answered.  All parties agreeable.   Gaylyn Cheers, MD  08/22/2017, 1:38 PM

## 2017-08-22 NOTE — Transfer of Care (Signed)
Immediate Anesthesia Transfer of Care Note  Patient: Emma Bowman  Procedure(s) Performed: ESOPHAGOGASTRODUODENOSCOPY (EGD) WITH PROPOFOL (N/A ) COLONOSCOPY WITH PROPOFOL (N/A )  Patient Location: PACU  Anesthesia Type:General  Level of Consciousness: sedated  Airway & Oxygen Therapy: Patient Spontanous Breathing and Patient connected to nasal cannula oxygen  Post-op Assessment: Report given to RN and Post -op Vital signs reviewed and stable  Post vital signs: Reviewed and stable  Last Vitals:  Vitals:   08/22/17 1249 08/22/17 1442  BP: (!) 145/106 127/78  Pulse: 77 98  Resp: 16 18  Temp: 37.2 C (!) 36.1 C  SpO2: 100% 100%    Last Pain:  Vitals:   08/22/17 1442  TempSrc: Tympanic         Complications: No apparent anesthesia complications

## 2017-08-22 NOTE — Op Note (Signed)
[facility] Patient Name: Emma Bowman Procedure Date: 08/22/2017 2:03 PM MRN: [id number] Account #: [id number] Date of Birth: 28-Oct-1970 Admit Type: Outpatient Age: 46 Room: [facility] ENDO ROOM 3 Gender: Female Note Status: Finalized Procedure:            Colonoscopy Indications:          Colon cancer screening in patient at increased risk:                        Family history of 1st-degree relative with colon polyps Providers:            Manya Silvas, MD Referring MD:         Kerin Perna MD, MD (Referring MD) Medicines:            Propofol per Anesthesia Complications:        No immediate complications. Procedure:            Pre-Anesthesia Assessment:                       - After reviewing the risks and benefits, the patient                        was deemed in satisfactory condition to undergo the                        procedure.                       After obtaining informed consent, the colonoscope was                        passed under direct vision. Throughout the procedure,                        the patient's blood pressure, pulse, and oxygen                        saturations were monitored continuously. The                        Colonoscope was introduced through the anus and                        advanced to the the cecum, identified by appendiceal                        orifice and ileocecal valve. The colonoscopy was                        performed without difficulty. The patient tolerated the                        procedure well. The quality of the bowel preparation                        was good. Findings:      A diminutive polyp was found in the recto-sigmoid colon. The polyp was       sessile. The polyp was removed with a jumbo cold forceps. Resection and       retrieval were complete.  A diminutive polyp was found in the descending colon. The polyp was       sessile. The polyp was removed with a jumbo cold  forceps. Resection and       retrieval were complete.      The exam was otherwise without abnormality.      Internal hemorrhoids were found during endoscopy. The hemorrhoids were       small and Grade I (internal hemorrhoids that do not prolapse). Impression:           - One diminutive polyp at the recto-sigmoid colon,                        removed with a jumbo cold forceps. Resected and                        retrieved.                       - One diminutive polyp in the descending colon, removed                        with a jumbo cold forceps. Resected and retrieved.                       - The examination was otherwise normal. Recommendation:       - Await pathology results. Manya Silvas, MD 08/22/2017 2:37:07 PM This report has been signed electronically. Number of Addenda: 0 Note Initiated On: 08/22/2017 2:03 PM Scope Withdrawal Time: 0 hours 9 minutes 24 seconds  Total Procedure Duration: 0 hours 15 minutes 25 seconds       [facility]

## 2017-08-22 NOTE — Anesthesia Preprocedure Evaluation (Signed)
Anesthesia Evaluation  Patient identified by MRN, date of birth, ID band Patient awake    Reviewed: Allergy & Precautions, NPO status , Patient's Chart, lab work & pertinent test results  Airway Mallampati: II       Dental  (+) Teeth Intact   Pulmonary sleep apnea ,     + decreased breath sounds      Cardiovascular Exercise Tolerance: Good  Rhythm:Regular     Neuro/Psych Depression    GI/Hepatic negative GI ROS, Neg liver ROS,   Endo/Other  Morbid obesity  Renal/GU   negative genitourinary   Musculoskeletal negative musculoskeletal ROS (+)   Abdominal (+) + obese,   Peds negative pediatric ROS (+)  Hematology   Anesthesia Other Findings   Reproductive/Obstetrics                             Anesthesia Physical Anesthesia Plan  ASA: III  Anesthesia Plan: General   Post-op Pain Management:    Induction: Intravenous  PONV Risk Score and Plan:   Airway Management Planned: Natural Airway and Nasal Cannula  Additional Equipment:   Intra-op Plan:   Post-operative Plan:   Informed Consent: I have reviewed the patients History and Physical, chart, labs and discussed the procedure including the risks, benefits and alternatives for the proposed anesthesia with the patient or authorized representative who has indicated his/her understanding and acceptance.     Plan Discussed with: CRNA  Anesthesia Plan Comments:         Anesthesia Quick Evaluation

## 2017-08-22 NOTE — Op Note (Signed)
[facility] Patient Name: Emma Bowman Procedure Date: 08/22/2017 2:04 PM MRN: [id number] Account #: [id number] Date of Birth: 07/24/1971 Admit Type: Outpatient Age: 46 Room: [facility] ENDO ROOM 3 Gender: Female Note Status: Finalized Procedure:            Upper GI endoscopy Indications:          Heartburn Providers:            Manya Silvas, MD Referring MD:         Kerin Perna MD, MD (Referring MD) Medicines:            Propofol per Anesthesia Complications:        No immediate complications. Procedure:            Pre-Anesthesia Assessment:                       - After reviewing the risks and benefits, the patient                        was deemed in satisfactory condition to undergo the                        procedure.                       After obtaining informed consent, the endoscope was                        passed under direct vision. Throughout the procedure,                        the patient's blood pressure, pulse, and oxygen                        saturations were monitored continuously. The Endoscope                        was introduced through the mouth, and advanced to the                        second part of duodenum. The upper GI endoscopy was                        accomplished without difficulty. The patient tolerated                        the procedure well. Findings:      Esophagitis with no bleeding was found 40 cm from the incisors. Biopsies       were taken with a cold forceps for histology.      The stomach was normal.      The examined duodenum was normal. Impression:           - Reflux esophagitis. Rule out Barrett's esophagus.                        Biopsied.                       - Normal stomach.                       -  Normal examined duodenum. Recommendation:       - Await pathology results. Manya Silvas, MD 08/22/2017 2:16:10 PM This report has been signed electronically. Number of Addenda:  0 Note Initiated On: 08/22/2017 2:04 PM      [facility]

## 2017-08-22 NOTE — Anesthesia Post-op Follow-up Note (Signed)
Anesthesia QCDR form completed.        

## 2017-08-23 ENCOUNTER — Encounter: Payer: Self-pay | Admitting: Unknown Physician Specialty

## 2017-08-23 NOTE — Anesthesia Postprocedure Evaluation (Signed)
Anesthesia Post Note  Patient: NASHALI DITMER  Procedure(s) Performed: ESOPHAGOGASTRODUODENOSCOPY (EGD) WITH PROPOFOL (N/A ) COLONOSCOPY WITH PROPOFOL (N/A )  Patient location during evaluation: PACU Anesthesia Type: General Level of consciousness: awake Pain management: pain level controlled Vital Signs Assessment: post-procedure vital signs reviewed and stable Respiratory status: nonlabored ventilation Cardiovascular status: stable Anesthetic complications: no     Last Vitals:  Vitals:   08/22/17 1502 08/22/17 1512  BP: 131/84 135/80  Pulse: 60 66  Resp: 19 18  Temp:    SpO2: 95% 100%    Last Pain:  Vitals:   08/23/17 0726  TempSrc:   PainSc: 0-No pain                 VAN STAVEREN,Roger Fasnacht

## 2017-08-24 ENCOUNTER — Other Ambulatory Visit: Payer: Self-pay | Admitting: Family Medicine

## 2017-08-24 DIAGNOSIS — Z1231 Encounter for screening mammogram for malignant neoplasm of breast: Secondary | ICD-10-CM

## 2017-08-25 LAB — SURGICAL PATHOLOGY

## 2017-11-21 ENCOUNTER — Ambulatory Visit
Admission: RE | Admit: 2017-11-21 | Discharge: 2017-11-21 | Disposition: A | Discharge status: Home / Self Care | Payer: BC Managed Care – PPO | Source: Ambulatory Visit | Attending: Family Medicine | Admitting: Family Medicine

## 2017-11-21 DIAGNOSIS — Z1231 Encounter for screening mammogram for malignant neoplasm of breast: Secondary | ICD-10-CM

## 2018-04-07 ENCOUNTER — Ambulatory Visit (INDEPENDENT_AMBULATORY_CARE_PROVIDER_SITE_OTHER): Payer: BC Managed Care – PPO | Admitting: Obstetrics & Gynecology

## 2018-04-07 ENCOUNTER — Encounter: Payer: Self-pay | Admitting: Obstetrics & Gynecology

## 2018-04-07 VITALS — BP 124/78 | HR 70 | Ht 67.0 in | Wt 343.0 lb

## 2018-04-07 DIAGNOSIS — N9089 Other specified noninflammatory disorders of vulva and perineum: Secondary | ICD-10-CM

## 2018-04-07 DIAGNOSIS — Z124 Encounter for screening for malignant neoplasm of cervix: Secondary | ICD-10-CM

## 2018-04-07 MED ORDER — ALCORTIN A 1-2-1 % EX GEL: CUTANEOUS | 1 refills | Status: DC

## 2018-04-07 NOTE — Progress Notes (Signed)
HPI:      Emma Bowman is a 47 y.o. [phone], Patient's last menstrual period was 04/06/2018 (exact date)., presents today for a problem visit.  She complains of:  Vulvar concern:   This is a 47 y.o. old Caucasian/White female who presents for the evaluation of vulvar lesion(s). She describes the vulvar lesion(s) as asymmetrical, elevated, irregular surface, painful, came to head and had discharge. She indicates that she has noticed 1 lesions that the average size is approximately 62mm to 59mm.  She indicates she first noticed the problem three weeks ago. She admits to symptoms of itching, burning, irritation and pain.  The following aggravating factors are identified: physical activity and wearing pads. The following alleviating factors are identified: analgesics.   PMHx: She  has a past medical history of ANS (autonomic nervous system) disease (06/15/2014), Asthma, Asthma, moderate persistent (04/22/2011), Cataplexy and narcolepsy (01/27/2013), Clinical depression (01/27/2013), Depression, Extreme obesity (04/22/2011), Herpes simplex type 1 infection (03/25/2015), Obstructive apnea (07/11/2015), and Pre-diabetes. Also,  has a past surgical history that includes Tubal ligation; Tonsilectomy, adenoidectomy, bilateral myringotomy and tubes; Tonsillectomy; Esophagogastroduodenoscopy (egd) with propofol (N/A, 08/22/2017); Colonoscopy with propofol (N/A, 08/22/2017); and Breast biopsy (Left, 2011)., family history includes Diabetes in her mother; Diverticulitis in her father.,  reports that she has never smoked. She has never used smokeless tobacco. She reports that she drinks alcohol. She reports that she does not use drugs.  She has a current medication list which includes the following prescription(s): albuterol, ketorolac, montelukast, mupirocin ointment, nystatin ointment, omeprazole, pantoprazole, sumatriptan, terbinafine, tramadol, vitamin d (ergocalciferol), amphetamine-dextroamphetamine,  antipyrine-benzocaine, beclomethasone, butalbital-acetaminophen-caffeine, calcium carbonate, chlorhexidine, clotrimazole-betamethasone, contrave, diclofenac, diclofenac sodium, duloxetine, fluticasone, fluticasone, alcortin a, ipratropium, meloxicam, metformin, modafinil, montelukast, vios lc sprint deluxe, neomycin-polymyxin-hydrocortisone, trazodone, and triamcinolone, and the following Facility-Administered Medications: triamcinolone acetonide. Also, is allergic to victoza [liraglutide].  Review of Systems  Constitutional: Negative for chills, fever and malaise/fatigue.  HENT: Negative for congestion, sinus pain and sore throat.   Eyes: Negative for blurred vision and pain.  Respiratory: Negative for cough and wheezing.   Cardiovascular: Negative for chest pain and leg swelling.  Gastrointestinal: Negative for abdominal pain, constipation, diarrhea, heartburn, nausea and vomiting.  Genitourinary: Negative for dysuria, frequency, hematuria and urgency.  Musculoskeletal: Negative for back pain, joint pain, myalgias and neck pain.  Skin: Negative for itching and rash.  Neurological: Negative for dizziness, tremors and weakness.  Endo/Heme/Allergies: Does not bruise/bleed easily.  Psychiatric/Behavioral: Negative for depression. The patient is not nervous/anxious and does not have insomnia.    Objective: BP 124/78   Pulse 70   Ht 5\' 7"  (1.702 m)   Wt (!) 343 lb (155.6 kg)   LMP 04/06/2018 (Exact Date)   BMI 53.72 kg/m  Physical Exam  Constitutional: She is oriented to person, place, and time. She appears well-developed and well-nourished. No distress.  Genitourinary: Pelvic exam was performed with patient supine.  There is lesion on the right labia. There is no rash or tenderness on the right labia. There is no rash, tenderness or lesion on the left labia.     Genitourinary Comments: Period today w tampon; defer exam  HENT:  Head: Normocephalic and atraumatic.  Nose: Nose normal.    Mouth/Throat: Oropharynx is clear and moist.  Abdominal: Soft. She exhibits no distension. There is no tenderness.  Musculoskeletal: Normal range of motion.  Neurological: She is alert and oriented to person, place, and time. No cranial nerve deficit.  Skin: Skin is warm and dry.  Psychiatric:  She has a normal mood and affect.   ASSESSMENT/PLAN:    Vulvar lesion    -  Primary   Screening for cervical cancer        Alcortin A for discomfort and swelling No sign of active infection (no ABX) or mass (no biopsy)  Annual/PAP due; in 2 weeks  Barnett Applebaum, MD, [facility] 04/07/2018  11:24 AM

## 2018-04-21 ENCOUNTER — Ambulatory Visit (INDEPENDENT_AMBULATORY_CARE_PROVIDER_SITE_OTHER): Payer: BC Managed Care – PPO | Admitting: Obstetrics & Gynecology

## 2018-04-21 ENCOUNTER — Other Ambulatory Visit (HOSPITAL_COMMUNITY)
Admission: RE | Admit: 2018-04-21 | Discharge: 2018-04-21 | Disposition: A | Discharge status: Home / Self Care | Payer: BC Managed Care – PPO | Source: Ambulatory Visit | Attending: Obstetrics & Gynecology | Admitting: Obstetrics & Gynecology

## 2018-04-21 ENCOUNTER — Encounter: Payer: Self-pay | Admitting: Obstetrics & Gynecology

## 2018-04-21 VITALS — BP 120/80 | Ht 67.0 in | Wt 342.0 lb

## 2018-04-21 DIAGNOSIS — Z1231 Encounter for screening mammogram for malignant neoplasm of breast: Secondary | ICD-10-CM

## 2018-04-21 DIAGNOSIS — Z0000 Encounter for general adult medical examination without abnormal findings: Secondary | ICD-10-CM

## 2018-04-21 DIAGNOSIS — Z1239 Encounter for other screening for malignant neoplasm of breast: Secondary | ICD-10-CM

## 2018-04-21 DIAGNOSIS — Z124 Encounter for screening for malignant neoplasm of cervix: Secondary | ICD-10-CM

## 2018-04-21 NOTE — Patient Instructions (Signed)
PAP every three years Mammogram every year    Call [phone] to schedule at [facility] yearly (with PCP)   

## 2018-04-21 NOTE — Progress Notes (Signed)
HPI:      Emma Bowman is a 47 y.o. [phone] who LMP was Patient's last menstrual period was 04/06/2018 (exact date)., she presents today for her annual examination. The patient has no complaints today. The patient is sexually active. Her last pap: was normal and last mammogram: approximate date 10/2017 and was normal. The patient does perform self breast exams.  There is notable family history of breast or ovarian cancer in her family.  The patient has regular exercise: yes.  The patient denies current symptoms of depression.  Colonoscopy every 5 years, UTD.  GYN History: Contraception: tubal ligation  PMHx: Past Medical History:  Diagnosis Date  . ANS (autonomic nervous system) disease 06/15/2014   Overview:  Please refer to the documentation related to her overnight sleep procedure on  04/24/2014 and at the [facility]  [phone]): -see detailed sleep report illustrating the contribution of her nighttime variability in heart rate to the fragmentation of her sleep process - we recommend a trial of auto-PAP to control the mild OSA, mostly REM-sleep dependent prior to consi  . Asthma   . Asthma, moderate persistent 04/22/2011  . 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 Clinical depression 01/27/2013  . Depression   . Extreme obesity 04/22/2011  . Herpes simplex type 1 infection 03/25/2015  . Obstructive apnea 07/11/2015  . Pre-diabetes    Past Surgical History:  Procedure Laterality Date  . BREAST BIOPSY Left 2011   benign  . COLONOSCOPY WITH PROPOFOL N/A 08/22/2017   Procedure: COLONOSCOPY WITH PROPOFOL;  Surgeon: Manya Silvas, MD;  Location: [facility] ENDOSCOPY;  Service: Endoscopy;  Laterality: N/A;  . ESOPHAGOGASTRODUODENOSCOPY (EGD) WITH PROPOFOL N/A 08/22/2017   Procedure: ESOPHAGOGASTRODUODENOSCOPY (EGD) WITH PROPOFOL;  Surgeon: Manya Silvas, MD;  Location: [facility] ENDOSCOPY;  Service: Endoscopy;  Laterality: N/A;  . TONSILECTOMY, ADENOIDECTOMY, BILATERAL  MYRINGOTOMY AND TUBES    . TONSILLECTOMY    . TUBAL LIGATION     Family History  Problem Relation Age of Onset  . Diabetes Mother   . Diverticulitis Father    Social History   Tobacco Use  . Smoking status: Never Smoker  . Smokeless tobacco: Never Used  Substance Use Topics  . Alcohol use: Yes    Alcohol/week: 0.0 oz  . Drug use: No    Current Outpatient Medications:  .  albuterol (PROVENTIL) (2.5 MG/3ML) 0.083% nebulizer solution, Inhale into the lungs., Disp: , Rfl:  .  amphetamine-dextroamphetamine (ADDERALL XR) 30 MG 24 hr capsule, Reported on 09/24/2015, Disp: , Rfl:  .  antipyrine-benzocaine (AURALGAN) otic solution, , Disp: , Rfl:  .  beclomethasone (QVAR) 80 MCG/ACT inhaler, , Disp: , Rfl:  .  butalbital-acetaminophen-caffeine (FIORICET, ESGIC) 50-325-40 MG tablet, Take 1-2 tablets by mouth every 6 (six) hours as needed for headache. (Patient not taking: Reported on 04/07/2018), Disp: 20 tablet, Rfl: 0 .  calcium carbonate (OS-CAL) 1250 (500 Ca) MG chewable tablet, Chew by mouth., Disp: , Rfl:  .  chlorhexidine (PERIDEX) 0.12 % solution, SWISH AND SPIT 15 MLS 2 (TWO) TIMES DAILY., Disp: , Rfl: 0 .  clotrimazole-betamethasone (LOTRISONE) cream, APPLY TO AFFECTED AREA TWICE A DAY, Disp: , Rfl:  .  CONTRAVE 8-90 MG TB12, , Disp: , Rfl:  .  diclofenac (VOLTAREN) 75 MG EC tablet, Take 1 tablet (75 mg total) by mouth 2 (two) times daily. (Patient not taking: Reported on 04/07/2018), Disp: 50 tablet, Rfl: 2 .  diclofenac sodium (VOLTAREN) 1 % GEL, , Disp: , Rfl:  .  DULoxetine (CYMBALTA) 30 MG capsule, Take by mouth., Disp: , Rfl:  .  fluticasone (FLONASE) 50 MCG/ACT nasal spray, Reported on 09/24/2015, Disp: , Rfl:  .  fluticasone (FLOVENT HFA) 220 MCG/ACT inhaler, Inhale into the lungs., Disp: , Rfl:  .  Iodoquinol-HC-Aloe Polysacch (ALCORTIN A) 1-2-1 % GEL, Apply to affected areas (external use only) daily PRN, Disp: 48 g, Rfl: 1 .  ipratropium (ATROVENT) 0.06 % nasal spray,  Place into the nose., Disp: , Rfl:  .  ketorolac (TORADOL) 10 MG tablet, TAKE 1 TABLET (10 MG TOTAL) BY MOUTH EVERY 6 (SIX) HOURS AS NEEDED FOR PAIN, Disp: , Rfl: 0 .  meloxicam (MOBIC) 15 MG tablet, Reported on 09/24/2015, Disp: , Rfl:  .  metFORMIN (GLUCOPHAGE) 500 MG tablet, Take 500 mg by mouth 2 (two) times daily with a meal., Disp: , Rfl:  .  modafinil (PROVIGIL) 200 MG tablet, Take 200 mg by mouth. , Disp: , Rfl:  .  montelukast (SINGULAIR) 10 MG tablet, Take by mouth., Disp: , Rfl:  .  montelukast (SINGULAIR) 10 MG tablet, TAKE 1 TABLET (10 MG TOTAL) BY MOUTH NIGHTLY., Disp: , Rfl: 5 .  mupirocin ointment (BACTROBAN) 2 %, APPLY TO AFFECTED AREA 3 TIMES A DAY, Disp: , Rfl: 0 .  Nebulizers (VIOS LC SPRINT DELUXE) MISC, Use as directed.  496.9, Disp: , Rfl:  .  neomycin-polymyxin-hydrocortisone (CORTISPORIN) 3.5-10000-1 otic suspension, PLACE 3 DROPS INTO BOTH EARS 4 (FOUR) TIMES DAILY. USE IN AFFECTED EAR., Disp: , Rfl: 0 .  nystatin ointment (MYCOSTATIN), Apply topically., Disp: , Rfl:  .  omeprazole (PRILOSEC) 40 MG capsule, , Disp: , Rfl:  .  pantoprazole (PROTONIX) 40 MG tablet, Take by mouth., Disp: , Rfl:  .  SUMAtriptan (IMITREX) 100 MG tablet, PLEASE SEE ATTACHED FOR DETAILED DIRECTIONS, Disp: , Rfl:  .  terbinafine (LAMISIL) 250 MG tablet, Take by mouth., Disp: , Rfl:  .  traMADol (ULTRAM) 50 MG tablet, , Disp: , Rfl:  .  traZODone (DESYREL) 100 MG tablet, Take by mouth., Disp: , Rfl:  .  triamcinolone (KENALOG) 0.1 % paste, , Disp: , Rfl:  .  Vitamin D, Ergocalciferol, (DRISDOL) 50000 units CAPS capsule, TAKE 1 CAPSULE (50,000 UNITS TOTAL) BY MOUTH ONCE A WEEK., Disp: , Rfl: 0  Current Facility-Administered Medications:  .  triamcinolone acetonide (KENALOG) 10 MG/ML injection 10 mg, 10 mg, Other, Once, Regal, Tamala Fothergill, DPM Allergies: Victoza [liraglutide]  Review of Systems  Constitutional: Negative for chills, fever and malaise/fatigue.  HENT: Negative for congestion, sinus  pain and sore throat.   Eyes: Negative for blurred vision and pain.  Respiratory: Negative for cough and wheezing.   Cardiovascular: Negative for chest pain and leg swelling.  Gastrointestinal: Negative for abdominal pain, constipation, diarrhea, heartburn, nausea and vomiting.  Genitourinary: Negative for dysuria, frequency, hematuria and urgency.  Musculoskeletal: Negative for back pain, joint pain, myalgias and neck pain.  Skin: Negative for itching and rash.  Neurological: Negative for dizziness, tremors and weakness.  Endo/Heme/Allergies: Does not bruise/bleed easily.  Psychiatric/Behavioral: Negative for depression. The patient is not nervous/anxious and does not have insomnia.     Objective: BP 120/80   Ht _0  (1.702 m)   Wt (!) 342 lb (155.1 kg)   LMP 04/06/2018 (Exact Date)   BMI 53.56 kg/m   Filed Weights   04/21/18 0813  Weight: (!) 342 lb (155.1 kg)   Body mass index is 53.56 kg/m. Physical Exam  Constitutional: She is oriented to person, place,  and time. She appears well-developed and well-nourished. No distress.  Genitourinary: Rectum normal, vagina normal and uterus normal. Pelvic exam was performed with patient supine. There is no rash or lesion on the right labia. There is no rash or lesion on the left labia. Vagina exhibits no lesion. No bleeding in the vagina. Right adnexum does not display mass and does not display tenderness. Left adnexum does not display mass and does not display tenderness. Cervix does not exhibit motion tenderness, lesion, friability or polyp.   Uterus is mobile and midaxial. Uterus is not enlarged or exhibiting a mass.  HENT:  Head: Normocephalic and atraumatic. Head is without laceration.  Right Ear: Hearing normal.  Left Ear: Hearing normal.  Nose: No epistaxis.  No foreign bodies.  Mouth/Throat: Uvula is midline, oropharynx is clear and moist and mucous membranes are normal.  Eyes: Pupils are equal, round, and reactive to light.    Neck: Normal range of motion. Neck supple. No thyromegaly present.  Cardiovascular: Normal rate and regular rhythm. Exam reveals no gallop and no friction rub.  No murmur heard. Pulmonary/Chest: Effort normal and breath sounds normal. No respiratory distress. She has no wheezes. Right breast exhibits no mass, no skin change and no tenderness. Left breast exhibits no mass, no skin change and no tenderness.  Abdominal: Soft. Bowel sounds are normal. She exhibits no distension. There is no tenderness. There is no rebound.  Musculoskeletal: Normal range of motion.  Neurological: She is alert and oriented to person, place, and time. No cranial nerve deficit.  Skin: Skin is warm and dry.  Psychiatric: She has a normal mood and affect. Judgment normal.  Vitals reviewed.   Assessment:  ANNUAL EXAM 1. Annual physical exam   2. Screening for cervical cancer   3. Screening for breast cancer   4. Morbid obesity ([place]) Chronic   Screening Plan:            1.  Cervical Screening-  Pap smear done today  2. Breast screening- Exam annually and mammogram>40 planned  Breast MRI recommended, declined. BRCA neg, T-C model 36% lifetime risk  3. Colonoscopy every 10 years, Hemoccult testing - after age 38  4. Labs managed by PCP  5. Counseling for contraception: bilateral tubal ligation   6. Morbid obesity ([place]) Cont weight loss measures Considering surgery    F/U  Return in about 1 year (around 04/22/2019) for Annual.  Barnett Applebaum, MD, [facility], [facility] 04/21/2018  8:16 AM

## 2018-04-25 LAB — CYTOLOGY - PAP
Diagnosis: NEGATIVE
HPV: NOT DETECTED

## 2018-05-17 ENCOUNTER — Encounter: Payer: Self-pay | Admitting: Obstetrics and Gynecology

## 2018-08-10 ENCOUNTER — Ambulatory Visit: Payer: BC Managed Care – PPO | Admitting: Obstetrics & Gynecology

## 2018-12-04 ENCOUNTER — Other Ambulatory Visit: Payer: Self-pay | Admitting: Family Medicine

## 2018-12-04 DIAGNOSIS — Z1231 Encounter for screening mammogram for malignant neoplasm of breast: Secondary | ICD-10-CM

## 2018-12-14 ENCOUNTER — Inpatient Hospital Stay: Admission: RE | Admit: 2018-12-14 | Payer: BC Managed Care – PPO | Source: Ambulatory Visit

## 2019-03-09 ENCOUNTER — Ambulatory Visit: Payer: BC Managed Care – PPO

## 2019-03-12 ENCOUNTER — Ambulatory Visit
Admission: RE | Admit: 2019-03-12 | Discharge: 2019-03-12 | Disposition: A | Discharge status: Home / Self Care | Payer: BC Managed Care – PPO | Source: Ambulatory Visit | Attending: Family Medicine | Admitting: Family Medicine

## 2019-03-12 ENCOUNTER — Other Ambulatory Visit: Payer: Self-pay

## 2019-03-12 DIAGNOSIS — Z1231 Encounter for screening mammogram for malignant neoplasm of breast: Secondary | ICD-10-CM

## 2019-03-13 ENCOUNTER — Other Ambulatory Visit: Payer: Self-pay | Admitting: Family Medicine

## 2019-03-13 DIAGNOSIS — R928 Other abnormal and inconclusive findings on diagnostic imaging of breast: Secondary | ICD-10-CM

## 2019-03-26 ENCOUNTER — Other Ambulatory Visit: Payer: Self-pay | Admitting: Family Medicine

## 2019-03-26 ENCOUNTER — Ambulatory Visit
Admission: RE | Admit: 2019-03-26 | Discharge: 2019-03-26 | Disposition: A | Discharge status: Home / Self Care | Payer: BC Managed Care – PPO | Source: Ambulatory Visit | Attending: Family Medicine | Admitting: Family Medicine

## 2019-03-26 ENCOUNTER — Other Ambulatory Visit: Payer: Self-pay

## 2019-03-26 DIAGNOSIS — R928 Other abnormal and inconclusive findings on diagnostic imaging of breast: Secondary | ICD-10-CM

## 2019-03-26 DIAGNOSIS — N63 Unspecified lump in unspecified breast: Secondary | ICD-10-CM

## 2019-05-17 ENCOUNTER — Ambulatory Visit: Payer: BC Managed Care – PPO | Admitting: Obstetrics & Gynecology

## 2019-06-07 ENCOUNTER — Other Ambulatory Visit: Payer: Self-pay

## 2019-06-07 ENCOUNTER — Ambulatory Visit (INDEPENDENT_AMBULATORY_CARE_PROVIDER_SITE_OTHER): Payer: BC Managed Care – PPO | Admitting: Obstetrics & Gynecology

## 2019-06-07 ENCOUNTER — Encounter: Payer: Self-pay | Admitting: Obstetrics & Gynecology

## 2019-06-07 VITALS — BP 110/70 | Ht 67.0 in | Wt 353.0 lb

## 2019-06-07 DIAGNOSIS — Z1239 Encounter for other screening for malignant neoplasm of breast: Secondary | ICD-10-CM

## 2019-06-07 DIAGNOSIS — Z01419 Encounter for gynecological examination (general) (routine) without abnormal findings: Secondary | ICD-10-CM | POA: Diagnosis not present

## 2019-06-07 NOTE — Patient Instructions (Signed)
PAP every three years Mammogram every year Colonoscopy every 5 years Labs yearly (with PCP)   

## 2019-06-07 NOTE — Progress Notes (Signed)
HPI:      [name] is a 48 y.o. [phone] who LMP was in the past, she presents today for her annual examination.  The patient has no complaints today. The patient is not currently sexually active. Herlast pap: approximate date 2019 and was normal and last mammogram: approximate date 2020 and was abnormal: and she has follow up next week.  The patient does perform self breast exams.  There is notable family history of breast or ovarian cancer in her family. The patient is not taking hormone replacement therapy. Patient denies post-menopausal vaginal bleeding.   The patient has regular exercise: yes. The patient denies current symptoms of depression.    GYN Hx: Last Colonoscopy:2 years ago. Normal.  Last DEXA: never ago.    PMHx: Past Medical History:  Diagnosis Date  . 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 Asthma   . Asthma, moderate persistent 04/22/2011  . BRCA negative 2013   BRCA neg; update testing letter sent 8/19  . 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 Clinical depression 01/27/2013  . Depression   . Extreme obesity 04/22/2011  . Family history of breast cancer   . Herpes simplex type 1 infection 03/25/2015  . Increased risk of breast cancer 2013   IBIS=36%  . Obstructive apnea 07/11/2015  . Pre-diabetes    Past Surgical History:  Procedure Laterality Date  . BREAST BIOPSY Left 2011   benign  . COLONOSCOPY WITH PROPOFOL N/A 08/22/2017   Procedure: COLONOSCOPY WITH PROPOFOL;  Surgeon: Emma Silvas, MD;  Location: [facility] ENDOSCOPY;  Service: Endoscopy;   Laterality: N/A;  . ESOPHAGOGASTRODUODENOSCOPY (EGD) WITH PROPOFOL N/A 08/22/2017   Procedure: ESOPHAGOGASTRODUODENOSCOPY (EGD) WITH PROPOFOL;  Surgeon: Emma Silvas, MD;  Location: [facility] ENDOSCOPY;  Service: Endoscopy;  Laterality: N/A;  . TONSILECTOMY, ADENOIDECTOMY, BILATERAL MYRINGOTOMY AND TUBES    . TONSILLECTOMY    . TUBAL LIGATION     Family History  Problem Relation Age of Onset  . Diabetes Mother   . Diverticulitis Father   . Breast cancer Sister 59   Social History   Tobacco Use  . Smoking status: Never Smoker  . Smokeless tobacco: Never Used  Substance Use Topics  . Alcohol use: Yes    Alcohol/week: 0.0 standard drinks  . Drug use: No    Current Outpatient Medications:  .  albuterol (PROVENTIL) (2.5 MG/3ML) 0.083% nebulizer solution, Inhale into the lungs., Disp: , Rfl:  .  diclofenac (VOLTAREN) 75 MG EC tablet, Take 1 tablet (75 mg total) by mouth 2 (two) times daily., Disp: 50 tablet, Rfl: 2 .  fluticasone (FLONASE) 50 MCG/ACT nasal spray, Reported on 09/24/2015, Disp: , Rfl:  .  Iodoquinol-HC-Aloe Polysacch (ALCORTIN A) 1-2-1 % GEL, Apply to affected areas (external use only) daily PRN, Disp: 48 g, Rfl: 1 .  Vitamin D, Ergocalciferol, (DRISDOL) 50000 units CAPS capsule, TAKE 1 CAPSULE (50,000 UNITS TOTAL) BY MOUTH ONCE A WEEK., Disp: , Rfl: 0 .  amphetamine-dextroamphetamine (ADDERALL XR) 30 MG 24 hr capsule, Reported on 09/24/2015, Disp: , Rfl:  .  antipyrine-benzocaine (AURALGAN) otic solution, , Disp: ,  Rfl:  .  beclomethasone (QVAR) 80 MCG/ACT inhaler, , Disp: , Rfl:  .  calcium carbonate (OS-CAL) 1250 (500 Ca) MG chewable tablet, Chew by mouth., Disp: , Rfl:  .  chlorhexidine (PERIDEX) 0.12 % solution, SWISH AND SPIT 15 MLS 2 (TWO) TIMES DAILY., Disp: , Rfl: 0 .  clotrimazole-betamethasone (LOTRISONE) cream, APPLY TO AFFECTED AREA TWICE A DAY, Disp: , Rfl:  .  CONTRAVE 8-90 MG TB12, , Disp: , Rfl:  .  diclofenac sodium (VOLTAREN) 1 % GEL, , Disp: , Rfl:   .  DULoxetine (CYMBALTA) 30 MG capsule, Take by mouth., Disp: , Rfl:  .  fluticasone (FLOVENT HFA) 220 MCG/ACT inhaler, Inhale into the lungs., Disp: , Rfl:  .  ipratropium (ATROVENT) 0.06 % nasal spray, Place into the nose., Disp: , Rfl:  .  ketorolac (TORADOL) 10 MG tablet, TAKE 1 TABLET (10 MG TOTAL) BY MOUTH EVERY 6 (SIX) HOURS AS NEEDED FOR PAIN, Disp: , Rfl: 0 .  meloxicam (MOBIC) 15 MG tablet, Reported on 09/24/2015, Disp: , Rfl:  .  metFORMIN (GLUCOPHAGE) 500 MG tablet, Take 500 mg by mouth 2 (two) times daily with a meal., Disp: , Rfl:  .  modafinil (PROVIGIL) 200 MG tablet, Take 200 mg by mouth. , Disp: , Rfl:  .  montelukast (SINGULAIR) 10 MG tablet, Take by mouth., Disp: , Rfl:  .  montelukast (SINGULAIR) 10 MG tablet, TAKE 1 TABLET (10 MG TOTAL) BY MOUTH NIGHTLY., Disp: , Rfl: 5 .  mupirocin ointment (BACTROBAN) 2 %, APPLY TO AFFECTED AREA 3 TIMES A DAY, Disp: , Rfl: 0 .  Nebulizers (VIOS LC SPRINT DELUXE) MISC, Use as directed.  496.9, Disp: , Rfl:  .  neomycin-polymyxin-hydrocortisone (CORTISPORIN) 3.5-10000-1 otic suspension, PLACE 3 DROPS INTO BOTH EARS 4 (FOUR) TIMES DAILY. USE IN AFFECTED EAR., Disp: , Rfl: 0 .  omeprazole (PRILOSEC) 40 MG capsule, , Disp: , Rfl:  .  pantoprazole (PROTONIX) 40 MG tablet, Take by mouth., Disp: , Rfl:  .  SUMAtriptan (IMITREX) 100 MG tablet, PLEASE SEE ATTACHED FOR DETAILED DIRECTIONS, Disp: , Rfl:  .  terbinafine (LAMISIL) 250 MG tablet, Take by mouth., Disp: , Rfl:  .  traMADol (ULTRAM) 50 MG tablet, , Disp: , Rfl:  .  traZODone (DESYREL) 100 MG tablet, Take by mouth., Disp: , Rfl:  .  triamcinolone (KENALOG) 0.1 % paste, , Disp: , Rfl:   Current Facility-Administered Medications:  .  triamcinolone acetonide (KENALOG) 10 MG/ML injection 10 mg, 10 mg, Other, Once, Emma Bowman, Emma Bowman, DPM Allergies: Victoza [liraglutide]  Review of Systems  Constitutional: Negative for chills, fever and malaise/fatigue.  HENT: Negative for congestion, sinus  pain and sore throat.   Eyes: Negative for blurred vision and pain.  Respiratory: Negative for cough and wheezing.   Cardiovascular: Negative for chest pain and leg swelling.  Gastrointestinal: Negative for abdominal pain, constipation, diarrhea, heartburn, nausea and vomiting.  Genitourinary: Negative for dysuria, frequency, hematuria and urgency.  Musculoskeletal: Negative for back pain, joint pain, myalgias and neck pain.  Skin: Negative for itching and rash.  Neurological: Negative for dizziness, tremors and weakness.  Endo/Heme/Allergies: Does not bruise/bleed easily.  Psychiatric/Behavioral: Negative for depression. The patient is not nervous/anxious and does not have insomnia.     Objective: BP 110/70   Ht 5' 7"  (1.702 m)   Wt (!) 353 lb (160.1 kg)   LMP 05/17/2019   BMI 55.29 kg/m   Filed Weights   06/07/19 1355  Weight: (!) 353 lb (160.1 kg)  Body mass index is 55.29 kg/m. Physical Exam Constitutional:      General: She is not in acute distress.    Appearance: She is well-developed. She is obese.  Genitourinary:     Pelvic exam was performed with patient supine.     Vagina, uterus and rectum normal.     No lesions in the vagina.     No vaginal bleeding.     No cervical motion tenderness, friability, lesion or polyp.     Uterus is mobile.     Uterus is not enlarged.     No uterine mass detected.    Uterus is midaxial.     No right or left adnexal mass present.     Right adnexa not tender.     Left adnexa not tender.  HENT:     Head: Normocephalic and atraumatic. No laceration.     Right Ear: Hearing normal.     Left Ear: Hearing normal.     Mouth/Throat:     Pharynx: Uvula midline.  Eyes:     Pupils: Pupils are equal, round, and reactive to light.  Neck:     Musculoskeletal: Normal range of motion and neck supple.     Thyroid: No thyromegaly.  Cardiovascular:     Rate and Rhythm: Normal rate and regular rhythm.     Heart sounds: No murmur. No friction  rub. No gallop.   Pulmonary:     Effort: Pulmonary effort is normal. No respiratory distress.     Breath sounds: Normal breath sounds. No wheezing.  Chest:     Breasts:        Right: No mass, skin change or tenderness.        Left: No mass, skin change or tenderness.  Abdominal:     General: Bowel sounds are normal. There is no distension.     Palpations: Abdomen is soft.     Tenderness: There is no abdominal tenderness. There is no rebound.  Musculoskeletal: Normal range of motion.  Neurological:     Mental Status: She is alert and oriented to person, place, and time.     Cranial Nerves: No cranial nerve deficit.  Skin:    General: Skin is warm and dry.  Psychiatric:        Judgment: Judgment normal.  Vitals signs reviewed.     Assessment: Annual Exam 1. Women's annual routine gynecological examination   2. Screening for breast cancer   3. Morbid obesity ([place])     Plan:            1.  Cervical Screening-  Pap smear schedule reviewed with patient, due 2022  2. Breast screening- Exam annually and mammogram scheduled; follow up MMG next week  3. Colonoscopy every 5 years, Hemoccult testing after age 80, Due 2023  4. Labs managed by PCP  5. Counseling for hormonal therapy: none              6. FRAX - FRAX score for assessing the 10 year probability for fracture calculated and discussed today.  Based on age and score today, DEXA is not currently scheduled.    F/U  Return in about 1 year (around 06/06/2020).  Barnett Applebaum, MD, [facility], [facility] 06/07/2019  2:08 PM

## 2019-06-14 ENCOUNTER — Other Ambulatory Visit: Payer: Self-pay

## 2019-06-14 ENCOUNTER — Ambulatory Visit
Admission: RE | Admit: 2019-06-14 | Discharge: 2019-06-14 | Disposition: A | Discharge status: Home / Self Care | Payer: BC Managed Care – PPO | Source: Ambulatory Visit | Attending: Family Medicine | Admitting: Family Medicine

## 2019-06-14 DIAGNOSIS — N63 Unspecified lump in unspecified breast: Secondary | ICD-10-CM

## 2019-06-27 ENCOUNTER — Other Ambulatory Visit: Payer: BC Managed Care – PPO

## 2019-11-06 ENCOUNTER — Ambulatory Visit: Payer: Self-pay

## 2019-11-16 ENCOUNTER — Ambulatory Visit: Payer: BC Managed Care – PPO

## 2019-11-19 ENCOUNTER — Ambulatory Visit: Payer: Self-pay | Admitting: Family Medicine

## 2019-11-19 ENCOUNTER — Other Ambulatory Visit: Payer: Self-pay

## 2019-11-19 ENCOUNTER — Encounter: Payer: Self-pay | Admitting: Family Medicine

## 2019-11-19 DIAGNOSIS — Z113 Encounter for screening for infections with a predominantly sexual mode of transmission: Secondary | ICD-10-CM

## 2019-11-19 LAB — WET PREP FOR TRICH, YEAST, CLUE
Trichomonas Exam: NEGATIVE
Yeast Exam: NEGATIVE

## 2019-11-19 NOTE — Progress Notes (Signed)
Wet mount reviewed, no tx per standing. Provider orders completed.

## 2019-11-19 NOTE — Progress Notes (Signed)
[facility] Department STI clinic/screening visit  Subjective:  Emma Bowman is a 49 y.o. female being seen today for  Chief Complaint  Patient presents with  . SEXUALLY TRANSMITTED DISEASE    STD screening including bloodwork     The patient reports they do not have symptoms. Patient reports that they do not desire a pregnancy in the next year. They reported they do not interested in discussing contraception today.   Patient has the following medical conditions:   Patient Active Problem List   Diagnosis Date Noted  . Obstructive apnea 07/11/2015  . Herpes simplex type 1 infection 03/25/2015  . Female genuine stress incontinence 01/02/2015  . Narcolepsy without cataplexy(347.00) 08/13/2014  . 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 Insomnia due to medical condition 06/15/2014  . Excessive urination at night 06/15/2014  . Idiopathic localized osteoarthropathy 11/01/2013  . Clinical depression 01/27/2013  . 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 LBP (low back pain) 01/02/2013  . Asthma, moderate persistent 04/22/2011  . Blood glucose elevated 04/22/2011  . Extreme obesity 04/22/2011  . Morbid obesity ([place]) 04/22/2011    HPI  Pt reports she is here for STI screen. Denies symptoms aside from irritation in her throat.  See flowsheet for further details and programmatic requirements.    No LMP recorded. E/C: n/a d/t BTL  No components found for: HCV  The following portions of the patient's history were reviewed and updated as appropriate: allergies, current medications, past medical history, past social history, past surgical history and problem list.  Objective:  There were no vitals filed for this visit.  Vitals and nursing note reviewed.   Constitutional:      Appearance: Normal appearance.  HENT:     Head: Normocephalic and atraumatic.     Mouth/Throat:     Mouth:  Mucous membranes are moist.     Pharynx: Oropharynx is clear. No oropharyngeal exudate or posterior oropharyngeal erythema.  Pulmonary:     Effort: Pulmonary effort is normal.  Abdominal:     General: Abdomen is flat.     Palpations: There is no mass.     Tenderness: There is no abdominal tenderness. There is no rebound.  Genitourinary:    Comments: Declines pelvic exam, prefers to self collect.  Lymphadenopathy:     Head:     Right side of head: No preauricular or posterior auricular adenopathy.     Left side of head: No preauricular or posterior auricular adenopathy.     Cervical: No cervical adenopathy.     Upper Body:     Right upper body: No supraclavicular or axillary adenopathy.     Left upper body: No supraclavicular or axillary adenopathy.  Skin:    General: Skin is warm and dry.     Findings: No rash.  Neurological:     Mental Status: She is alert and oriented to person, place, and time.      Assessment and Plan:  Emma Bowman is a 49 y.o. female presenting to the [facility] for STI screening   1. Screening examination for venereal disease -Screenings today as below. Treat wet prep per standing order. -Patient does not meet criteria for HepB, HepC Screening.  -Counseled on warning s/sx and when to seek care. Recommended condom use with all sex and discussed importance of condom use for STI prevention. - WET PREP FOR TRICH, YEAST, CLUE - Gonococcus culture -  throat - Chlamydia/Gonorrhea [place] [facility] - HIV [place] [facility] - Syphilis Serology,  Lab     Return for screening as needed.  No future appointments.  Kandee Keen, PA-C

## 2019-11-23 LAB — GONOCOCCUS CULTURE

## 2020-03-05 ENCOUNTER — Other Ambulatory Visit: Payer: Self-pay

## 2020-03-05 ENCOUNTER — Encounter: Payer: Self-pay | Admitting: Family Medicine

## 2020-03-05 ENCOUNTER — Ambulatory Visit: Payer: Self-pay | Admitting: Family Medicine

## 2020-03-05 DIAGNOSIS — Z113 Encounter for screening for infections with a predominantly sexual mode of transmission: Secondary | ICD-10-CM

## 2020-03-05 LAB — WET PREP FOR TRICH, YEAST, CLUE
Trichomonas Exam: NEGATIVE
Yeast Exam: NEGATIVE

## 2020-03-05 NOTE — Progress Notes (Signed)
[facility] Department STI clinic/screening visit  Subjective:  Emma Bowman is a 49 y.o. female being seen today for an STI screening visit. The patient reports they do have symptoms.  Patient reports that they do not desire a pregnancy in the next year.   They reported they are not interested in discussing contraception today.  LMP 02/19/2020   Patient has the following medical conditions:   Patient Active Problem List   Diagnosis Date Noted  . Obstructive apnea 07/11/2015  . Herpes simplex type 1 infection 03/25/2015  . Female genuine stress incontinence 01/02/2015  . Narcolepsy without cataplexy(347.00) 08/13/2014  . 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 Insomnia due to medical condition 06/15/2014  . Excessive urination at night 06/15/2014  . Idiopathic localized osteoarthropathy 11/01/2013  . Clinical depression 01/27/2013  . 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 LBP (low back pain) 01/02/2013  . Asthma, moderate persistent 04/22/2011  . Blood glucose elevated 04/22/2011  . Extreme obesity 04/22/2011  . Morbid obesity ([place]) 04/22/2011    Chief Complaint  Patient presents with  . SEXUALLY TRANSMITTED DISEASE    HPI  Patient reports that she is here for STD screen.  States that she found out that expartner has had other partners.  She has symptoms- abd pain that's intermittent, daily, she believes may be d/t being overweight, small amt of thick, yellowish disch and very mild vaginal irritation x couple weeks and 'a little" sore throat x 7-8 days.  Client states that she has had pain with sex that is not a new symptom but no change in the intensity.  Last HIV test per patient/review of record was couple years ago Patient reports last pap was unknown.   See flowsheet for further details and programmatic requirements.    The following portions of the patient's history were  reviewed and updated as appropriate: allergies, current medications, past medical history, past social history, past surgical history and problem list.  Objective:  There were no vitals filed for this visit.  Physical Exam Vitals and nursing note reviewed.  Constitutional:      Appearance: Normal appearance.  HENT:     Head: Normocephalic and atraumatic.     Mouth/Throat:     Mouth: Mucous membranes are moist.     Pharynx: Oropharynx is clear. No oropharyngeal exudate or posterior oropharyngeal erythema.  Pulmonary:     Effort: Pulmonary effort is normal.  Abdominal:     General: Abdomen is flat.     Palpations: There is no mass.     Tenderness: There is no abdominal tenderness. There is no rebound.  Genitourinary:    General: Normal vulva.     Exam position: Lithotomy position.     Pubic Area: No rash or pubic lice.      Labia:        Right: No rash or lesion.        Left: No rash or lesion.      Vagina: Normal. No vaginal discharge, erythema, bleeding or lesions.     Cervix: Normal.     Rectum: Normal.     Comments: Unable to palpate uterus and adnexa d/t body habitus Lymphadenopathy:     Head:     Right side of head: No preauricular or posterior auricular adenopathy.     Left side of head: No preauricular or posterior auricular adenopathy.     Cervical: No cervical adenopathy.  Upper Body:     Right upper body: No supraclavicular or axillary adenopathy.     Left upper body: No supraclavicular or axillary adenopathy.     Lower Body: No right inguinal adenopathy. No left inguinal adenopathy.  Skin:    General: Skin is warm and dry.     Findings: No rash.  Neurological:     Mental Status: She is alert and oriented to person, place, and time.    Assessment and Plan:  Emma Bowman is a 49 y.o. female presenting to the [facility] for STI screening  1. Screening examination for venereal disease  - WET PREP FOR [place], YEAST, CLUE- negative  results - Gonococcus culture - Chlamydia/Gonorrhea [facility] - HIV [facility] - Syphilis Serology, [place] [facility] -[place] always.    No follow-ups on file.  No future appointments.  Hassell Done, FNP

## 2020-03-05 NOTE — Progress Notes (Signed)
Wet mount reviewed, no treatment indicated..Niemah Schwebke Brewer-Jensen, RN 

## 2020-03-11 LAB — GONOCOCCUS CULTURE

## 2020-05-07 ENCOUNTER — Other Ambulatory Visit: Payer: Self-pay | Admitting: Family Medicine

## 2020-05-07 DIAGNOSIS — Z1231 Encounter for screening mammogram for malignant neoplasm of breast: Secondary | ICD-10-CM

## 2020-06-16 ENCOUNTER — Other Ambulatory Visit: Payer: Self-pay

## 2020-06-16 ENCOUNTER — Ambulatory Visit
Admission: RE | Admit: 2020-06-16 | Discharge: 2020-06-16 | Disposition: A | Discharge status: Home / Self Care | Payer: BC Managed Care – PPO | Source: Ambulatory Visit | Attending: Family Medicine | Admitting: Family Medicine

## 2020-06-16 DIAGNOSIS — Z1231 Encounter for screening mammogram for malignant neoplasm of breast: Secondary | ICD-10-CM

## 2020-06-20 ENCOUNTER — Ambulatory Visit: Payer: BC Managed Care – PPO | Admitting: Obstetrics & Gynecology

## 2020-07-01 ENCOUNTER — Other Ambulatory Visit: Payer: Self-pay

## 2020-07-01 ENCOUNTER — Encounter: Payer: Self-pay | Admitting: Obstetrics & Gynecology

## 2020-07-01 ENCOUNTER — Ambulatory Visit (INDEPENDENT_AMBULATORY_CARE_PROVIDER_SITE_OTHER): Payer: BC Managed Care – PPO | Admitting: Obstetrics & Gynecology

## 2020-07-01 VITALS — BP 120/80 | Ht 67.0 in | Wt 334.0 lb

## 2020-07-01 DIAGNOSIS — Z1231 Encounter for screening mammogram for malignant neoplasm of breast: Secondary | ICD-10-CM | POA: Diagnosis not present

## 2020-07-01 DIAGNOSIS — Z01419 Encounter for gynecological examination (general) (routine) without abnormal findings: Secondary | ICD-10-CM

## 2020-07-01 NOTE — Patient Instructions (Signed)
PAP every three years Mammogram every year    Call [phone] to schedule at [facility] yearly (with PCP)  Thank you for choosing [facility]. As part of our ongoing efforts to improve patient experience, we would appreciate your feedback. Please fill out the short survey that you will receive by mail or MyChart. Your opinion is important to us! - Dr. Arpan Eskelson   

## 2020-07-01 NOTE — Progress Notes (Signed)
HPI:      [name] is a 49 y.o. [phone] who LMP was Patient's last menstrual period was 06/11/2020., she presents today for her annual examination. The patient has no complaints today. The patient is sexually active. Her last pap: approximate date 2019 and was normal and last mammogram: approximate date 05/2020 and was normal. The patient does perform self breast exams.  There is no notable family history of breast or ovarian cancer in her family.  The patient has regular exercise: yes.  The patient denies current symptoms of depression.    GYN History: Contraception: tubal ligation  Reg cycles  PMHx: Past Medical History:  Diagnosis Date  . 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 Asthma   . Asthma, moderate persistent 04/22/2011  . BRCA negative 2013   BRCA neg; update testing letter sent 8/19  . 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 Clinical depression 01/27/2013  . Depression   . Extreme obesity 04/22/2011  . Family history of breast cancer   . Herpes simplex type 1 infection 03/25/2015  . Increased risk of breast cancer 2013   IBIS=36%  . Obstructive apnea 07/11/2015  . Pre-diabetes    Past Surgical History:  Procedure Laterality Date  . BREAST BIOPSY Left 2011   benign  . COLONOSCOPY WITH PROPOFOL N/A 08/22/2017   Procedure: COLONOSCOPY WITH PROPOFOL;  Surgeon: Manya Silvas, MD;  Location: [facility] ENDOSCOPY;  Service: Endoscopy;  Laterality: N/A;  . ESOPHAGOGASTRODUODENOSCOPY (EGD) WITH PROPOFOL N/A 08/22/2017   Procedure: ESOPHAGOGASTRODUODENOSCOPY (EGD) WITH  PROPOFOL;  Surgeon: Manya Silvas, MD;  Location: [facility] ENDOSCOPY;  Service: Endoscopy;  Laterality: N/A;  . TONSILECTOMY, ADENOIDECTOMY, BILATERAL MYRINGOTOMY AND TUBES    . TONSILLECTOMY    . TUBAL LIGATION     Family History  Problem Relation Age of Onset  . Diabetes Mother   . Diverticulitis Father   . Breast cancer Sister 26   Social History   Tobacco Use  . Smoking status: Never Smoker  . Smokeless tobacco: Never Used  Vaping Use  . Vaping Use: Never used  Substance Use Topics  . Alcohol use: Yes    Alcohol/week: 0.0 standard drinks    Comment: 1-2x/yr  . Drug use: No    Current Outpatient Medications:  .  albuterol (PROVENTIL) (2.5 MG/3ML) 0.083% nebulizer solution, Inhale into the lungs., Disp: , Rfl:  .  clotrimazole-betamethasone (LOTRISONE) cream, APPLY TO AFFECTED AREA TWICE A DAY, Disp: , Rfl:  .  diclofenac (VOLTAREN) 75 MG EC tablet, Take 1 tablet (75 mg total) by mouth 2 (two) times daily., Disp: 50 tablet, Rfl: 2 .  diclofenac sodium (VOLTAREN) 1 % GEL, , Disp: , Rfl:  .  fluticasone (FLONASE) 50 MCG/ACT nasal spray, Reported on 09/24/2015, Disp: , Rfl:  .  ketorolac (TORADOL) 10 MG tablet, TAKE 1 TABLET (10 MG TOTAL) BY MOUTH EVERY 6 (SIX) HOURS AS NEEDED FOR PAIN, Disp: , Rfl: 0 .  mupirocin ointment (BACTROBAN) 2 %, APPLY TO AFFECTED AREA 3 TIMES A DAY, Disp: , Rfl: 0 .  Nebulizers (VIOS LC SPRINT DELUXE) MISC, Use  as directed.  496.9, Disp: , Rfl:  .  SUMAtriptan (IMITREX) 100 MG tablet, PLEASE SEE ATTACHED FOR DETAILED DIRECTIONS, Disp: , Rfl:  .  terbinafine (LAMISIL) 250 MG tablet, Take by mouth., Disp: , Rfl:  .  traMADol (ULTRAM) 50 MG tablet, , Disp: , Rfl:  .  triamcinolone (KENALOG) 0.1 % paste, , Disp: , Rfl:  .  Vitamin D, Ergocalciferol, (DRISDOL) 50000 units CAPS capsule, TAKE 1 CAPSULE (50,000 UNITS TOTAL) BY MOUTH ONCE A WEEK., Disp: , Rfl: 0 .  DULoxetine (CYMBALTA) 30 MG capsule, Take by mouth., Disp: , Rfl:  .  fluticasone (FLOVENT HFA)  220 MCG/ACT inhaler, Inhale into the lungs., Disp: , Rfl:  .  ipratropium (ATROVENT) 0.06 % nasal spray, Place into the nose., Disp: , Rfl:  .  modafinil (PROVIGIL) 200 MG tablet, Take 200 mg by mouth. , Disp: , Rfl:  .  traZODone (DESYREL) 100 MG tablet, Take by mouth., Disp: , Rfl:   Current Facility-Administered Medications:  .  triamcinolone acetonide (KENALOG) 10 MG/ML injection 10 mg, 10 mg, Other, Once, Regal, Tamala Fothergill, DPM Allergies: Victoza [liraglutide]  Review of Systems  Constitutional: Negative for chills, fever and malaise/fatigue.  HENT: Negative for congestion, sinus pain and sore throat.   Eyes: Negative for blurred vision and pain.  Respiratory: Negative for cough and wheezing.   Cardiovascular: Negative for chest pain and leg swelling.  Gastrointestinal: Negative for abdominal pain, constipation, diarrhea, heartburn, nausea and vomiting.  Genitourinary: Negative for dysuria, frequency, hematuria and urgency.  Musculoskeletal: Negative for back pain, joint pain, myalgias and neck pain.  Skin: Negative for itching and rash.  Neurological: Negative for dizziness, tremors and weakness.  Endo/Heme/Allergies: Does not bruise/bleed easily.  Psychiatric/Behavioral: Negative for depression. The patient is not nervous/anxious and does not have insomnia.     Objective: BP 120/80   Ht 5' 7"  (1.702 m)   Wt (!) 334 lb (151.5 kg)   LMP 06/11/2020   BMI 52.31 kg/m   Filed Weights   07/01/20 1556  Weight: (!) 334 lb (151.5 kg)   Body mass index is 52.31 kg/m. Physical Exam Constitutional:      General: She is not in acute distress.    Appearance: She is well-developed. She is obese.  Genitourinary:     Pelvic exam was performed with patient supine.     Vagina, uterus and rectum normal.     No lesions in the vagina.     No vaginal bleeding.     No cervical motion tenderness, friability, lesion or polyp.     Uterus is mobile.     Uterus is not enlarged.     No uterine  mass detected.    Uterus is midaxial.     No right or left adnexal mass present.     Right adnexa not tender.     Left adnexa not tender.  HENT:     Head: Normocephalic and atraumatic. No laceration.     Right Ear: Hearing normal.     Left Ear: Hearing normal.     Mouth/Throat:     Pharynx: Uvula midline.  Eyes:     Pupils: Pupils are equal, round, and reactive to light.  Neck:     Thyroid: No thyromegaly.  Cardiovascular:     Rate and Rhythm: Normal rate and regular rhythm.     Heart sounds: No murmur heard.  No friction rub. No gallop.   Pulmonary:     Effort: Pulmonary effort is normal.  No respiratory distress.     Breath sounds: Normal breath sounds. No wheezing.  Chest:     Breasts:        Right: No mass, skin change or tenderness.        Left: No mass, skin change or tenderness.  Abdominal:     General: Bowel sounds are normal. There is no distension.     Palpations: Abdomen is soft.     Tenderness: There is no abdominal tenderness. There is no rebound.  Musculoskeletal:        General: Normal range of motion.     Cervical back: Normal range of motion and neck supple.  Neurological:     Mental Status: She is alert and oriented to person, place, and time.     Cranial Nerves: No cranial nerve deficit.  Skin:    General: Skin is warm and dry.  Psychiatric:        Judgment: Judgment normal.  Vitals reviewed.     Assessment:  ANNUAL EXAM 1. Women's annual routine gynecological examination   2. Encounter for screening mammogram for malignant neoplasm of breast   3. Morbid obesity ([place])      Screening Plan:            1.  Cervical Screening-  Pap smear schedule reviewed with patient  2. Breast screening- Exam annually and mammogram>40 planned   3. Colonoscopy every 10 years, Hemoccult testing - after age 33  4. Labs managed by PCP  5. Counseling for contraception: bilateral tubal ligation    6. Morbid obesity ([place]) Considering bariatric tx (meds, vs  surgery)      F/U  No follow-ups on file.  Barnett Applebaum, MD, [facility], [facility] 07/01/2020  4:16 PM

## 2020-07-03 ENCOUNTER — Encounter: Payer: Self-pay | Admitting: Obstetrics and Gynecology

## 2021-03-23 ENCOUNTER — Telehealth: Payer: Self-pay | Admitting: Licensed Clinical Social Worker

## 2021-03-23 NOTE — Telephone Encounter (Signed)
Pt. Left vm requesting appt.

## 2021-03-31 ENCOUNTER — Ambulatory Visit: Payer: BC Managed Care – PPO | Admitting: Licensed Clinical Social Worker

## 2021-03-31 DIAGNOSIS — F331 Major depressive disorder, recurrent, moderate: Secondary | ICD-10-CM
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
## 2021-04-07 ENCOUNTER — Ambulatory Visit: Payer: BC Managed Care – PPO | Admitting: Licensed Clinical Social Worker

## 2021-04-07 DIAGNOSIS — F331 Major depressive disorder, recurrent, moderate: Secondary | ICD-10-CM

## 2021-04-07 NOTE — Progress Notes (Signed)
Counselor/Therapist Progress Note  Patient ID: Emma Bowman, MRN: [id number],    Date: 04/07/2021  Time Spent: 50 minutes    Treatment Type: Psychotherapy  Reported Symptoms:  Depressed mood, anhedonia, anxiety, anxious thought patterns  Mental Status Exam:  Appearance:   Casual and Well Groomed     Behavior:  Appropriate and Sharing  Motor:  Normal  Speech/Language:   Clear and Coherent  Affect:  Appropriate, Congruent, and Full Range  Mood:  normal  Thought process:  normal  Thought content:    WNL  Sensory/Perceptual disturbances:    WNL  Orientation:  oriented to person, place, time/date, and situation  Attention:  Good  Concentration:  Good  Memory:  WNL  Fund of knowledge:   Good  Insight:    Good  Judgment:   Good  Impulse Control:  Good   Risk Assessment: Danger to Self:  No Self-injurious Behavior: No Danger to Others: No Duty to Warn:no Physical Aggression / Violence:No  Access to Firearms a concern: No  Gang Involvement:No   Subjective: Patient was engaged and cooperative throughout the session using time effectively to discuss thoughts, feelings, and treatment planning. Patient voices continued motivation for treatment and understanding of depression issues. Patient is likely to benefit from future treatment because she is  motivated to decrease depression.    Interventions: Cognitive Behavioral Therapy Established psychological safety.  Checked in with patient and reviewed previous session, including assessment and goal of treatment. Reviewed CBTs. Explored patient's goal of treatment and worked collaboratively to develop CBT treatment plan. Provided support through active listening, validation of feelings, and highlighted patient's strengths.  Diagnosis:   ICD-10-CM   1. Major depressive disorder, recurrent episode, moderate (HCC)  F33.1      Plan: Patient's goal of treatment is to get more strategies to cope with pain, coping with life overall and  with depression.  Treatment Target: Understand the relationship between thoughts, emotions, and behaviors  Psychoeducation on CBT model   Teach the connection between thoughts, emotions, and behaviors  Treatment Target: Increase realistic balanced thinking -to learn how to replace thinking with thoughts that are more accurate or helpful Explore patient's thoughts, beliefs, automatic thoughts, assumptions  Identify and replace unhelpful thinking patterns (upsetting ideas, self-talk and mental images) Process distress and allow for emotional release  Questioning and challenging thoughts Cognitive reappraisal  Restructuring, Socratic questioning  Provided psychoeducation on core beliefs, explore, and assist patient in identifying core beliefs and Modify underlying beliefs  Treatment Target: Reducing vulnerability to "emotional mind" Values clarification   Setting boundaries Problem solving  Self-care - nutrition, sleep, exercise  Increase positive events both pleasurable and meaningful  Treatment Target: Understand the relationship between thoughts, emotions, and behaviors  Psychoeducation on CBT model   Teach the connection between thoughts, emotions, and behaviors  Enhance emotional awareness and discrimination of emotions  Understand thoughts, emotions, and behaviors  Identify and describe emotions  Identifying functions and reinforces of thoughts and emotions  Treatment Target: Increase coping skills Mindfulness practices  Deep breathing  STOP technique  Future Appointments  Date Time Provider [place]  04/14/2021 11:00 AM Milton Ferguson, LCSW [facility] None   Milton Ferguson, LCSW

## 2021-04-14 ENCOUNTER — Ambulatory Visit: Payer: BC Managed Care – PPO | Admitting: Licensed Clinical Social Worker

## 2021-04-14 DIAGNOSIS — F331 Major depressive disorder, recurrent, moderate: Secondary | ICD-10-CM

## 2021-04-14 NOTE — Progress Notes (Signed)
Counselor/Therapist Progress Note  Patient ID: Emma Bowman, MRN: [id number],    Date: 04/14/2021  Time Spent: 48 minutes    Treatment Type: Psychotherapy  Reported Symptoms: Anhedonia, Fatigue, Physical aches and pain, and low mood  Mental Status Exam:  Appearance:   Neat and Well Groomed     Behavior:  Appropriate and Sharing  Motor:  Normal  Speech/Language:   Clear and Coherent and Normal Rate  Affect:  Appropriate, Congruent, and Full Range  Mood:  dysthymic  Thought process:  goal directed  Thought content:    WNL  Sensory/Perceptual disturbances:    WNL  Orientation:  oriented to person, place, time/date, situation, and day of week  Attention:  Good  Concentration:  Good  Memory:  WNL  Fund of knowledge:   Good  Insight:    Good  Judgment:   Good  Impulse Control:  Good   Risk Assessment: Danger to Self:  No Self-injurious Behavior: No Danger to Others: No Duty to Warn:no Physical Aggression / Violence:No  Access to Firearms a concern: No  Gang Involvement:No   Subjective: Patient was engaged and cooperative throughout the session using time effectively to discuss thoughts and feelings. Patient voices continued motivation for treatment and understanding of depression issues. Patient is likely to benefit from future treatment because she remains motivated to decrease symptoms and improve functioning and reports a willingness to try new coping tools, including mindfulness.     Interventions: Cognitive Behavioral Therapy and Mindfulness Meditation Established psychological safety. Checked in with patient regarding her week. Engaged patient in processing current psychosocial stressors, continuing depressive symptoms due to multiple stressors including challenges in relationship with her adult children. Discussed boundary setting and effective communication strategies. Provided Psychoeducation on mindfulness, engaged patient in mindfulness exercise, processed exercise,  and contracted with patient to complete daily. Provided support through active listening, validation of feelings, and highlighted patient's strengths.   Diagnosis:   ICD-10-CM   1. Major depressive disorder, recurrent episode, moderate (HCC)  F33.1       Plan: Patient's goal of treatment is to get more strategies to cope with pain, coping with life overall and with depression.  Treatment Target: Understand the relationship between thoughts, emotions, and behaviors Psychoeducation on CBT model   Teach the connection between thoughts, emotions, and behaviors Treatment Target: Increase realistic balanced thinking -to learn how to replace thinking with thoughts that are more accurate or helpful Explore patient's thoughts, beliefs, automatic thoughts, assumptions Identify and replace unhelpful thinking patterns (upsetting ideas, self-talk and mental images) Process distress and allow for emotional release Questioning and challenging thoughts Cognitive reappraisal Restructuring, Socratic questioning Provided psychoeducation on core beliefs, explore, and assist patient in identifying core beliefs and Modify underlying beliefs Treatment Target: Reducing vulnerability to "emotional mind" Values clarification   Setting boundaries Problem solving Self-care - nutrition, sleep, exercise Increase positive events both pleasurable and meaningful Treatment Target: Understand the relationship between thoughts, emotions, and behaviors Psychoeducation on CBT model   Teach the connection between thoughts, emotions, and behaviors Enhance emotional awareness and discrimination of emotions Understand thoughts, emotions, and behaviors Identify and describe emotions Identifying functions and reinforces of thoughts and emotions Treatment Target: Increase coping skills Mindfulness practices Deep breathing STOP technique  Future Appointments  Date Time Provider [place]  04/21/2021 11:00 AM  Milton Ferguson, LCSW [facility] None   Milton Ferguson, LCSW

## 2021-04-21 ENCOUNTER — Ambulatory Visit: Payer: Self-pay | Admitting: Licensed Clinical Social Worker

## 2021-04-21 ENCOUNTER — Ambulatory Visit: Payer: BC Managed Care – PPO | Admitting: Licensed Clinical Social Worker

## 2021-04-21 DIAGNOSIS — F331 Major depressive disorder, recurrent, moderate: Secondary | ICD-10-CM

## 2021-04-21 NOTE — Progress Notes (Signed)
Counselor/Therapist Progress Note  Patient ID: Emma Bowman, MRN: [id number],    Date: 04/21/2021  Time Spent: 40 minutes    Treatment Type: Psychotherapy  Reported Symptoms: Sleep disturbance, Fatigue, Physical aches and pain, and depressed mood  Mental Status Exam:  Appearance:   Well Groomed     Behavior:  Appropriate and Sharing  Motor:  Normal  Speech/Language:   Clear and Coherent  Affect:  Appropriate, Congruent, and Full Range  Mood:  euthymic  Thought process:  normal  Thought content:    WNL  Sensory/Perceptual disturbances:    WNL  Orientation:  oriented to person, place, time/date, and situation  Attention:  Good  Concentration:  Good  Memory:  WNL  Fund of knowledge:   Good  Insight:    Good  Judgment:   Good  Impulse Control:  Good   Risk Assessment: Danger to Self:  No Self-injurious Behavior: No Danger to Others: No Duty to Warn:no Physical Aggression / Violence:No  Access to Firearms a concern: No  Gang Involvement:No   Subjective: Patient was engaged and cooperative throughout the session using time effectively to discuss thoughts, feelings and coping skills. Patient voices continued motivation for treatment and understanding of depression issues. Patient is likely to benefit from future treatment because she remains motivated to decrease depressive symptoms and improve functioning.   Interventions: Cognitive Behavioral Therapy and Mindfulness Meditation Established psychological safety. Checked in with patient regarding her week. Reviewed previous session regarding effective communication strategies and mindfulness. Explored patient's challenges with low energy and sleep disturbance. Using CBTI strategies provided psychoeducation on Sleep Hygiene, assisted patient in developing a bedtime routine, discussed getting out of bed if not asleep within 15-20 minutes, discussed options for relaxation tasks when waking in the night, including reading, mindfulness  exercise, drinking warm calming tea, listening to nature sounds. Encouraged patient to take medications as prescribed. Patient contracted to begin working on sleep routine tomorrow. Provided support through active listening, validation of feelings, and highlighted patient's strengths.   Diagnosis:   ICD-10-CM   1. Major depressive disorder, recurrent episode, moderate (HCC)  F33.1      Plan: Patient's goal of treatment is to get more strategies to cope with pain, coping with life overall and with depression.  Treatment Target: Understand the relationship between thoughts, emotions, and behaviors Psychoeducation on CBT model   Teach the connection between thoughts, emotions, and behaviors Treatment Target: Increase realistic balanced thinking -to learn how to replace thinking with thoughts that are more accurate or helpful Explore patient's thoughts, beliefs, automatic thoughts, assumptions Identify and replace unhelpful thinking patterns (upsetting ideas, self-talk and mental images) Process distress and allow for emotional release Questioning and challenging thoughts Cognitive reappraisal Restructuring, Socratic questioning Provided psychoeducation on core beliefs, explore, and assist patient in identifying core beliefs and Modify underlying beliefs Treatment Target: Reducing vulnerability to "emotional mind" Values clarification   Setting boundaries Problem solving Self-care - nutrition, sleep hygiene, exercise Increase positive events both pleasurable and meaningful Treatment Target: Understand the relationship between thoughts, emotions, and behaviors Psychoeducation on CBT model   Teach the connection between thoughts, emotions, and behaviors Enhance emotional awareness and discrimination of emotions Understand thoughts, emotions, and behaviors Identify and describe emotions Identifying functions and reinforces of thoughts and emotions Treatment Target: Increase coping  skills Mindfulness practices Deep breathing STOP technique  Future Appointments  Date Time Provider [place]  04/28/2021 10:00 AM Milton Ferguson, LCSW [facility] None    Milton Ferguson, LCSW

## 2021-04-28 ENCOUNTER — Ambulatory Visit: Payer: BC Managed Care – PPO | Admitting: Licensed Clinical Social Worker

## 2021-04-28 DIAGNOSIS — F331 Major depressive disorder, recurrent, moderate: Secondary | ICD-10-CM

## 2021-04-28 NOTE — Progress Notes (Signed)
Counselor/Therapist Progress Note  Patient ID: Emma Bowman, MRN: [id number],    Date: 04/28/2021  Time Spent: 50 minutes    Treatment Type: Psychotherapy  Reported Symptoms:  depressed mood, anhedonia, physical pain, sleep disturbance  Mental Status Exam:  Appearance:   Neat and Well Groomed     Behavior:  Appropriate and Sharing  Motor:  Normal  Speech/Language:   Clear and Coherent and Normal Rate  Affect:  Appropriate, Congruent, and Full Range  Mood:  normal  Thought process:  normal  Thought content:    WNL  Sensory/Perceptual disturbances:    WNL  Orientation:  oriented to person, place, time/date, and situation  Attention:  Good  Concentration:  Good  Memory:  WNL  Fund of knowledge:   Good  Insight:    Good  Judgment:   Good  Impulse Control:  Good   Risk Assessment: Danger to Self:  No Self-injurious Behavior: No Danger to Others: No Duty to Warn:no Physical Aggression / Violence:No  Access to Firearms a concern: No  Gang Involvement:No   Subjective: Patient was engaged and cooperative throughout the session using time effectively to discuss thoughts, feelings, and coping skills. Patient voices continued motivation for treatment and understanding of depression issues and the need to use coping skills to manage symptoms. Patient is likely to benefit from future treatment because she remains motivated to decrease depression and reports benefit of regular sessions in addressing these symptoms.   Interventions: Cognitive Behavioral Therapy and Mindfulness Meditation Established psychological safety. Checked in with patient regarding her week. Reviewed previous session regarding sleep hygiene. Taught patient about values, value narratives and value driven behaviors. Provided support through active listening, validation of feelings, and highlighted patient's strengths.    Diagnosis:   ICD-10-CM   1. Major depressive disorder, recurrent episode, moderate (HCC)   F33.1       Plan: Continue to discuss values.  Patient's goal of treatment is to get more strategies to cope with pain, coping with life overall and with depression.  Treatment Target: Understand the relationship between thoughts, emotions, and behaviors Psychoeducation on CBT model   Teach the connection between thoughts, emotions, and behaviors Treatment Target: Increase realistic balanced thinking -to learn how to replace thinking with thoughts that are more accurate or helpful Explore patient's thoughts, beliefs, automatic thoughts, assumptions Identify and replace unhelpful thinking patterns (upsetting ideas, self-talk and mental images) Process distress and allow for emotional release Questioning and challenging thoughts Cognitive reappraisal Restructuring, Socratic questioning Provided psychoeducation on core beliefs, explore, and assist patient in identifying core beliefs and Modify underlying beliefs Treatment Target: Reducing vulnerability to "emotional mind" Values clarification   Setting boundaries Problem solving Self-care - nutrition, sleep hygiene, exercise Increase positive events both pleasurable and meaningful Treatment Target: Understand the relationship between thoughts, emotions, and behaviors Psychoeducation on CBT model   Teach the connection between thoughts, emotions, and behaviors Enhance emotional awareness and discrimination of emotions Understand thoughts, emotions, and behaviors Identify and describe emotions Identifying functions and reinforces of thoughts and emotions Treatment Target: Increase coping skills Mindfulness practices Deep breathing STOP technique  Future Appointments  Date Time Provider [place]  05/12/2021 10:00 AM Milton Ferguson, LCSW [facility] None    Milton Ferguson, LCSW

## 2021-05-12 ENCOUNTER — Ambulatory Visit: Payer: BC Managed Care – PPO | Admitting: Licensed Clinical Social Worker

## 2021-05-12 DIAGNOSIS — F331 Major depressive disorder, recurrent, moderate: Secondary | ICD-10-CM

## 2021-05-12 NOTE — Progress Notes (Signed)
Counselor/Therapist Progress Note  Patient ID: CHIA DAHLMAN, MRN: [id number],    Date: 05/12/2021  Time Spent: 45 minutes    Treatment Type: Psychotherapy  Reported Symptoms: Sleep disturbance, Physical aches and pain, and low mood, anxious thoughts   Mental Status Exam:  Appearance:   Neat and Well Groomed     Behavior:  Appropriate and Sharing  Motor:  Normal  Speech/Language:   Clear and Coherent and Normal Rate  Affect:  Appropriate, Congruent, and Full Range  Mood:  normal  Thought process:  normal  Thought content:    WNL  Sensory/Perceptual disturbances:    WNL  Orientation:  oriented to person, place, time/date, and situation  Attention:  Good  Concentration:  Good  Memory:  WNL  Fund of knowledge:   Good  Insight:    Good  Judgment:   Good  Impulse Control:  Good   Risk Assessment: Danger to Self:  No Self-injurious Behavior: No Danger to Others: No Duty to Warn:no Physical Aggression / Violence:No  Access to Firearms a concern: No  Gang Involvement:No   Subjective: Patient was engaged and cooperative throughout the session using time effectively to discuss thoughts and feelings and coping skills. Patient voices continued motivation for treatment and understanding of depression issues. Patient is likely to benefit from future treatment because she remains motivated to decrease depressive symptoms and reports benefit of regular sessions in addressing these symptoms.   Interventions: Cognitive Behavioral Therapy Established psychological safety. Checked in with patient regarding her week. Reviewed previous session regarding values, mindfulness, and boundary setting. Explored patient's challenges with living within her values and boundary setting. Taught patient about the 5 family roles. Provided support through active listening, validation of feelings, and highlighted patient's strengths.   Diagnosis:   ICD-10-CM   1. Major depressive disorder, recurrent  episode, moderate (HCC)  F33.1      Plan: Patient's goal of treatment is to get more strategies to cope with pain, coping with life overall and with depression.  Treatment Target: Understand the relationship between thoughts, emotions, and behaviors Psychoeducation on CBT model   Teach the connection between thoughts, emotions, and behaviors Enhance emotional awareness and discrimination of emotions Understand thoughts, emotions, and behaviors Identify and describe emotions Identifying functions and reinforces of thoughts and emotions Treatment Target: Increase realistic balanced thinking -to learn how to replace thinking with thoughts that are more accurate or helpful Explore patient's thoughts, beliefs, automatic thoughts, assumptions Identify and replace unhelpful thinking patterns (upsetting ideas, self-talk and mental images) Process distress and allow for emotional release Questioning and challenging thoughts Cognitive reappraisal Restructuring, Socratic questioning Provided psychoeducation on core beliefs, explore, and assist patient in identifying core beliefs and Modify underlying beliefs Treatment Target: Reducing vulnerability to "emotional mind" Values clarification   Setting boundaries Problem solving Family roles  Self-care - nutrition, sleep hygiene, exercise Increase positive events both pleasurable and meaningful Treatment Target: Increase coping skills Mindfulness practices Deep breathing STOP technique  Future Appointments  Date Time Provider [place]  05/18/2021  4:30 PM Milton Ferguson, LCSW [facility] None    Milton Ferguson, LCSW

## 2021-05-18 ENCOUNTER — Ambulatory Visit: Payer: BC Managed Care – PPO | Admitting: Licensed Clinical Social Worker

## 2021-05-18 NOTE — Progress Notes (Unsigned)
Counselor/Therapist Progress Note  Patient ID: Emma Bowman, MRN: [id number],    Date: 05/18/2021  Time Spent: ***   Treatment Type: Psychotherapy  Reported Symptoms: {CHL AMB Reported [phone]}  Mental Status Exam:  Appearance:   {PSY:22683}     Behavior:  {PSY:[id number]}  Motor:  {PSY:22302}  Speech/Language:   {PSY:22685}  Affect:  {PSY:22687}  Mood:  {PSY:31886}  Thought process:  {PSY:31888}  Thought content:    {[phone]}  Sensory/Perceptual disturbances:    {PSY:[phone]}  Orientation:  {PSY:30297}  Attention:  {PSY:22877}  Concentration:  {[phone]}  Memory:  {PSY:[id number]}  Fund of knowledge:   {[phone]}  Insight:    {[phone]}  Judgment:   {[phone]}  Impulse Control:  {[phone]}   Risk Assessment: Danger to Self:  {PSY:22692} Self-injurious Behavior: {PSY:22692} Danger to Others: {PSY:22692} Duty to Warn:{PSY:311194} Physical Aggression / Violence:{PSY:21197} Access to Firearms a concern: {PSY:21197} Gang Involvement:{PSY:21197}  Subjective: Patient was engaged and cooperative throughout the session using time effectively to discuss   Patient voices continued motivation for treatment and understanding of  . Patient is likely to benefit from future treatment because  remains motivated to decrease  And   and reports benefit of regular sessions in addressing these symptoms.   Interventions: {PSY:[phone]} Established psychological safety. Checked in with patient regarding her week. Reviewed previous session regarding  Therapist assisted, actively listened, taught, shared, role/played, provided   Diagnosis:   ICD-10-CM   1. Major depressive disorder, recurrent episode, moderate (HCC)  F33.1       Plan: Patient's goal of treatment is to get more strategies to cope with pain, coping with life overall and with depression.  Treatment Target: Understand the relationship between thoughts, emotions, and  behaviors Psychoeducation on CBT model   Teach the connection between thoughts, emotions, and behaviors Enhance emotional awareness and discrimination of emotions Understand thoughts, emotions, and behaviors Identify and describe emotions Identifying functions and reinforces of thoughts and emotions Treatment Target: Increase realistic balanced thinking -to learn how to replace thinking with thoughts that are more accurate or helpful Explore patient's thoughts, beliefs, automatic thoughts, assumptions Identify and replace unhelpful thinking patterns (upsetting ideas, self-talk and mental images) Process distress and allow for emotional release Questioning and challenging thoughts Cognitive reappraisal Restructuring, Socratic questioning Provided psychoeducation on core beliefs, explore, and assist patient in identifying core beliefs and Modify underlying beliefs Treatment Target: Reducing vulnerability to "emotional mind" Values clarification   Setting boundaries Problem solving Family roles  Self-care - nutrition, sleep hygiene, exercise Increase positive events both pleasurable and meaningful Treatment Target: Increase coping skills Mindfulness practices Deep breathing STOP technique   Future Appointments  Date Time Provider [place]  05/18/2021  4:30 PM Milton Ferguson, LCSW [facility] None    Milton Ferguson, LCSW

## 2021-05-26 ENCOUNTER — Ambulatory Visit: Payer: BC Managed Care – PPO | Admitting: Licensed Clinical Social Worker

## 2021-05-26 DIAGNOSIS — F331 Major depressive disorder, recurrent, moderate: Secondary | ICD-10-CM

## 2021-05-26 NOTE — Progress Notes (Signed)
Counselor/Therapist Progress Note  Patient ID: Emma Bowman, MRN: [id number],    Date: 05/26/2021  Time Spent: 45 minutes    Treatment Type: Psychotherapy  Reported Symptoms: Sleep disturbance, Fatigue, Physical aches and pain, and low mood  Mental Status Exam:  Appearance:   Well Groomed     Behavior:  Appropriate, Sharing, and Rationalizing  Motor:  Normal  Speech/Language:   Normal Rate  Affect:  Appropriate, Congruent, and Full Range  Mood:  normal  Thought process:  normal  Thought content:    WNL  Sensory/Perceptual disturbances:    WNL  Orientation:  oriented to person, place, time/date, and situation  Attention:  Good  Concentration:  Good  Memory:  WNL  Fund of knowledge:   Good  Insight:    Good  Judgment:   Good  Impulse Control:  Good   Risk Assessment: Danger to Self:  No Self-injurious Behavior: No Danger to Others: No Duty to Warn:no Physical Aggression / Violence:No  Access to Firearms a concern: No  Gang Involvement:No   Subjective: Patient was engaged and cooperative throughout the session using time effectively to discuss thoughts and feelings and to practice mindfulness. Patient voices continued motivation for treatment and understanding of depression issues, but prefers to reach out for appt. As needed due to work schedule conflict. Patient is likely to benefit from future treatment because she has a desire to decrease depressive symptoms and reports benefit of regular sessions in addressing these symptoms.   Interventions: Mindfulness; Health Education / psychoeducation  Established psychological safety. Checked in with patient. Continued to teach patient about mindfulness. Engaged patient in mindfulness exercise, processed exercise and encouraged patient to utilize daily. Reviewed sleep hygiene tips. Provided support through active listening, validation of feelings, and highlighted patient's strengths.   Diagnosis:   ICD-10-CM   1. Major  depressive disorder, recurrent episode, moderate (HCC)  F33.1      Plan: Patient's goal of treatment is to get more strategies to cope with pain, coping with life overall and with depression.  Treatment Target: Understand the relationship between thoughts, emotions, and behaviors Psychoeducation on CBT model   Teach the connection between thoughts, emotions, and behaviors Enhance emotional awareness and discrimination of emotions Understand thoughts, emotions, and behaviors Identify and describe emotions Identifying functions and reinforces of thoughts and emotions Treatment Target: Increase realistic balanced thinking -to learn how to replace thinking with thoughts that are more accurate or helpful Explore patient's thoughts, beliefs, automatic thoughts, assumptions Identify and replace unhelpful thinking patterns (upsetting ideas, self-talk and mental images) Process distress and allow for emotional release Questioning and challenging thoughts Cognitive reappraisal Restructuring, Socratic questioning Provided psychoeducation on core beliefs, explore, and assist patient in identifying core beliefs and Modify underlying beliefs Treatment Target: Reducing vulnerability to "emotional mind" Values clarification   Setting boundaries Problem solving Family roles  Self-care - nutrition, sleep hygiene, exercise Increase positive events both pleasurable and meaningful Treatment Target: Increase coping skills Mindfulness practices Deep breathing STOP technique  No future appointments. Patient will reach out to schedule as needed.   Milton Ferguson, LCSW

## 2021-07-06 ENCOUNTER — Other Ambulatory Visit: Payer: Self-pay | Admitting: Family Medicine

## 2021-07-06 DIAGNOSIS — Z1231 Encounter for screening mammogram for malignant neoplasm of breast: Secondary | ICD-10-CM

## 2021-07-08 ENCOUNTER — Other Ambulatory Visit: Payer: Self-pay

## 2021-07-08 ENCOUNTER — Ambulatory Visit
Admission: RE | Admit: 2021-07-08 | Discharge: 2021-07-08 | Disposition: A | Discharge status: Home / Self Care | Payer: BC Managed Care – PPO | Source: Ambulatory Visit | Attending: Family Medicine | Admitting: Family Medicine

## 2021-07-08 DIAGNOSIS — Z1231 Encounter for screening mammogram for malignant neoplasm of breast: Secondary | ICD-10-CM

## 2021-10-09 ENCOUNTER — Other Ambulatory Visit: Payer: Self-pay

## 2021-10-09 ENCOUNTER — Ambulatory Visit (INDEPENDENT_AMBULATORY_CARE_PROVIDER_SITE_OTHER): Payer: BC Managed Care – PPO | Admitting: Obstetrics & Gynecology

## 2021-10-09 ENCOUNTER — Other Ambulatory Visit (HOSPITAL_COMMUNITY)
Admission: RE | Admit: 2021-10-09 | Discharge: 2021-10-09 | Disposition: A | Discharge status: Home / Self Care | Payer: BC Managed Care – PPO | Source: Ambulatory Visit | Attending: Obstetrics & Gynecology | Admitting: Obstetrics & Gynecology

## 2021-10-09 ENCOUNTER — Encounter: Payer: Self-pay | Admitting: Obstetrics & Gynecology

## 2021-10-09 VITALS — BP 120/80 | Ht 67.0 in | Wt 329.0 lb

## 2021-10-09 DIAGNOSIS — Z124 Encounter for screening for malignant neoplasm of cervix: Secondary | ICD-10-CM

## 2021-10-09 DIAGNOSIS — N9489 Other specified conditions associated with female genital organs and menstrual cycle: Secondary | ICD-10-CM

## 2021-10-09 DIAGNOSIS — D219 Benign neoplasm of connective and other soft tissue, unspecified: Secondary | ICD-10-CM

## 2021-10-09 NOTE — Progress Notes (Signed)
HPI: Patient is a 51 y.o. [id number] who LMP was Patient's last menstrual period was 09/21/2021., presents today for a problem visit.  She complains of recent findings of Right adnexal mass by CT - Pelvis, done for LLQ and L sided pain (concern for kidney stone at the time)..  Pt has had symptoms of  left sided pains, radiating to back .  Occas period, heavy when she has them, every 2-4 mos.    CT pelvis: The appendix is normal.   Patient has undergone previous tubal ligation surgery. The fibroid detected  at ultrasound is not discriminated from patient's myometrium on this study  without intravenous contrast.. There is a fat-containing mass in the right  adnexa measuring 6.5 x 4.5 x 5.5 cm. This is most consistent with an  ovarian dermoid. This was not seen on the ultrasound however right adnexa  could not be visualized on the previous ultrasound. No left adnexal mass is  detected.   The bladder is decompressed.   IMPRESSION:  1. Negative for renal calculus or hydronephrosis. There is a calcific  density in the left pelvis measuring 4 mm which conceivably could be a  nonobstructing distal ureteral calculus. It may simply be an adjacent  vascular calcification.  2. Patient with fat-containing right adnexal mass most consistent with an  ovarian dermoid. See above discussion. [place] referral recommended  3. Patient's known uterine fibroid was not visualized on the current CT  likely due to lack of contrast.  4. Previous bilateral tubal ligation  3. Small umbilical hernia containing only fat.   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 Also,  has a past surgical history that includes Tubal ligation; Tonsilectomy, adenoidectomy, bilateral myringotomy and tubes; Tonsillectomy; Esophagogastroduodenoscopy (egd) with propofol (N/A, 08/22/2017); Colonoscopy with propofol (N/A, 08/22/2017); and Breast biopsy (Left, 2011)., family history includes Breast cancer (age of onset: 53) in her sister; Diabetes in her mother; Diverticulitis in her father.,  reports that she has never smoked. She has never used smokeless tobacco. She reports current alcohol use. She reports that she does not use drugs.  She has a current medication list which includes the following prescription(s): albuterol, clotrimazole-betamethasone, diclofenac, diclofenac sodium, fluticasone, ketorolac, mupirocin ointment, vios lc sprint deluxe, sumatriptan, terbinafine, tramadol, vitamin d (ergocalciferol), duloxetine, fluticasone, ipratropium, modafinil, trazodone, and triamcinolone, and the following Facility-Administered Medications: triamcinolone acetonide. Also, is allergic to victoza [liraglutide].  Review of Systems  Constitutional:  Negative for chills, fever and malaise/fatigue.  HENT:  Negative for congestion, sinus pain and sore throat.   Eyes:  Negative for blurred vision and pain.  Respiratory:  Negative for cough and wheezing.   Cardiovascular:  Negative for chest pain and leg swelling.  Gastrointestinal:  Negative for abdominal pain, constipation, diarrhea, heartburn, nausea and vomiting.  Genitourinary:  Negative for dysuria, frequency, hematuria and urgency.  Musculoskeletal:  Negative for back pain, joint pain, myalgias and neck pain.  Skin:  Negative for itching and rash.  Neurological:  Negative for dizziness, tremors and weakness.  Endo/Heme/Allergies:  Does not bruise/bleed easily.  Psychiatric/Behavioral:  Negative for depression. The patient is not nervous/anxious and does not have insomnia.    Objective: BP  120/80    Ht 5' 7"  (  1.702 m)    Wt (!) 329 lb (149.2 kg)    LMP 09/21/2021    BMI 51.53 kg/m  Physical Exam Constitutional:      General: She is not in acute distress.    Appearance: She is well-developed.  Genitourinary:     Right Labia: No rash or tenderness.    Left Labia: No tenderness or rash.    No vaginal erythema or bleeding.      Right Adnexa: not tender and no mass present.    Left Adnexa: not tender and no mass present.    No cervical motion tenderness, discharge, polyp or nabothian cyst.     Uterus is not enlarged.     No uterine mass detected.    Pelvic exam was performed with patient in the lithotomy position.  HENT:     Head: Normocephalic and atraumatic.     Nose: Nose normal.  Abdominal:     General: There is no distension.     Palpations: Abdomen is soft.     Tenderness: There is no abdominal tenderness.  Musculoskeletal:        General: Normal range of motion.  Neurological:     Mental Status: She is alert and oriented to person, place, and time.     Cranial Nerves: No cranial nerve deficit.  Skin:    General: Skin is warm and dry.  Psychiatric:        Attention and Perception: Attention normal.        Mood and Affect: Mood and affect normal.        Speech: Speech normal.        Behavior: Behavior normal.        Thought Content: Thought content normal.        Judgment: Judgment normal.    ASSESSMENT/PLAN:    Problem List Items Addressed This Visit   None Visit Diagnoses     Adnexal mass    -  Primary   Relevant Orders   US PELVIC COMPLETE WITH TRANSVAGINAL   Fibroid       Screening for cervical cancer       Relevant Orders   Cytology - PAP     Pain is opposite the side where cyst was seen.  Will better characterize w [place] next.  If not causing sx's, then would not do surgery, but monitor for rapid growth over time w serial [place] or even MRI.  Fibroids small, chronic, and periods no worse, so not inclined to offer [place] or hysterectomy at this  time.  Barnett Applebaum, MD, [facility], [facility] 10/09/2021  10:54 AM

## 2021-10-14 LAB — CYTOLOGY - PAP
Comment: NEGATIVE
Diagnosis: NEGATIVE
High risk HPV: NEGATIVE

## 2021-12-17 ENCOUNTER — Ambulatory Visit: Payer: BC Managed Care – PPO | Admitting: Obstetrics & Gynecology

## 2021-12-24 ENCOUNTER — Other Ambulatory Visit: Payer: Self-pay | Admitting: Obstetrics & Gynecology

## 2021-12-24 DIAGNOSIS — N9489 Other specified conditions associated with female genital organs and menstrual cycle: Secondary | ICD-10-CM

## 2022-07-05 ENCOUNTER — Other Ambulatory Visit: Payer: Self-pay | Admitting: Family Medicine

## 2022-07-05 DIAGNOSIS — Z1231 Encounter for screening mammogram for malignant neoplasm of breast: Secondary | ICD-10-CM

## 2022-07-31 IMAGING — MG MM DIGITAL SCREENING BILAT W/ TOMO AND CAD
8 of 18 series · 8 of 40 positions shown · non-contrast
Comparison: Previous exam(s).

CLINICAL DATA: Screening.

EXAM:
DIGITAL SCREENING BILATERAL MAMMOGRAM WITH TOMOSYNTHESIS AND CAD
TECHNIQUE: Bilateral screening digital craniocaudal and mediolateral oblique
mammograms were obtained. Bilateral screening digital breast
tomosynthesis was performed. The images were evaluated with
computer-aided detection.

[L CC synth-2D]
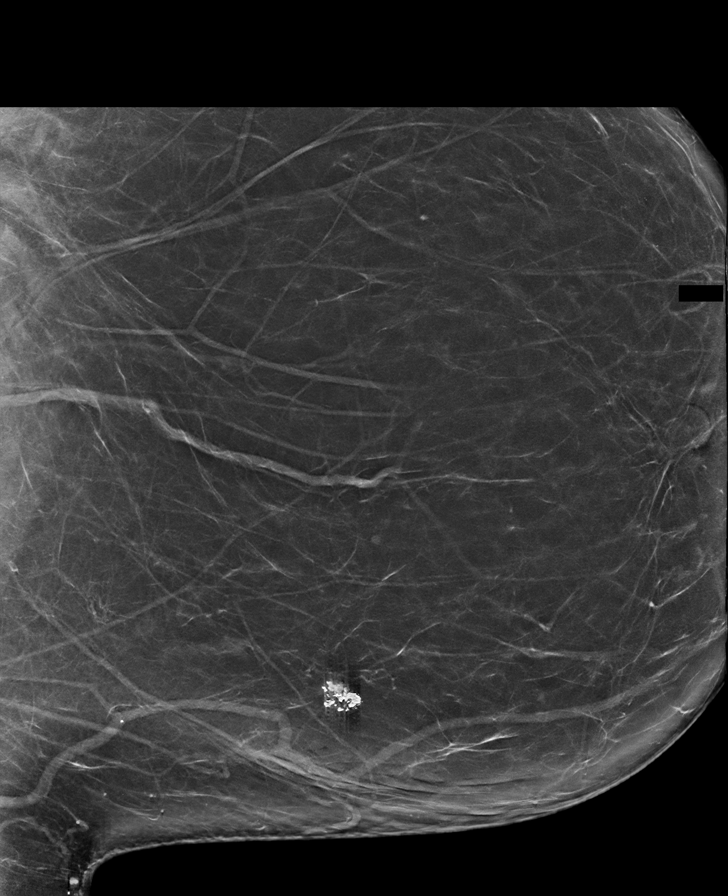

[R MLO synth-2D (1 of 2)]
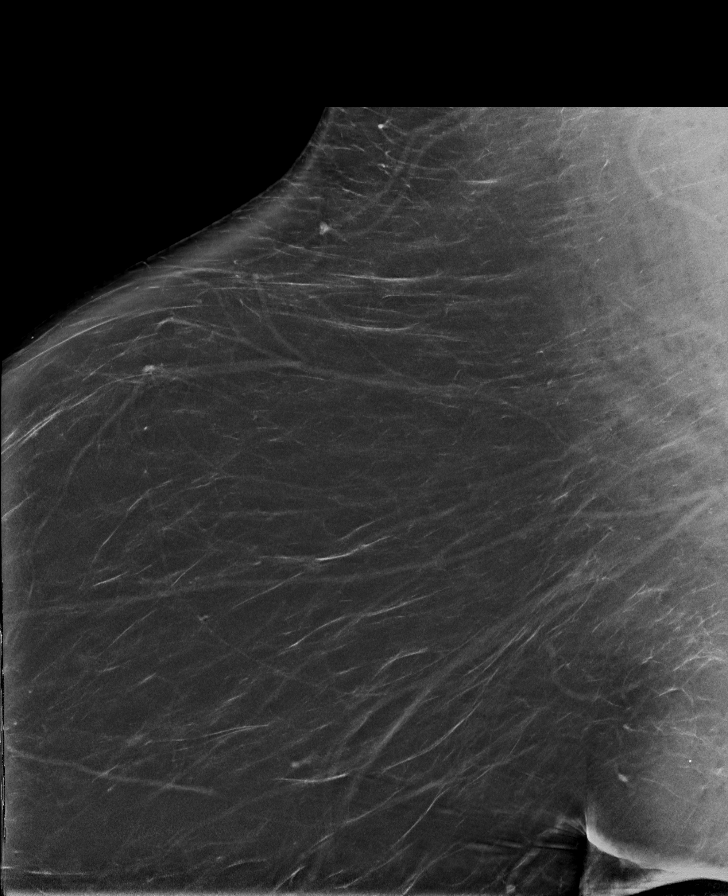

[L MLO synth-2D (1 of 2)]
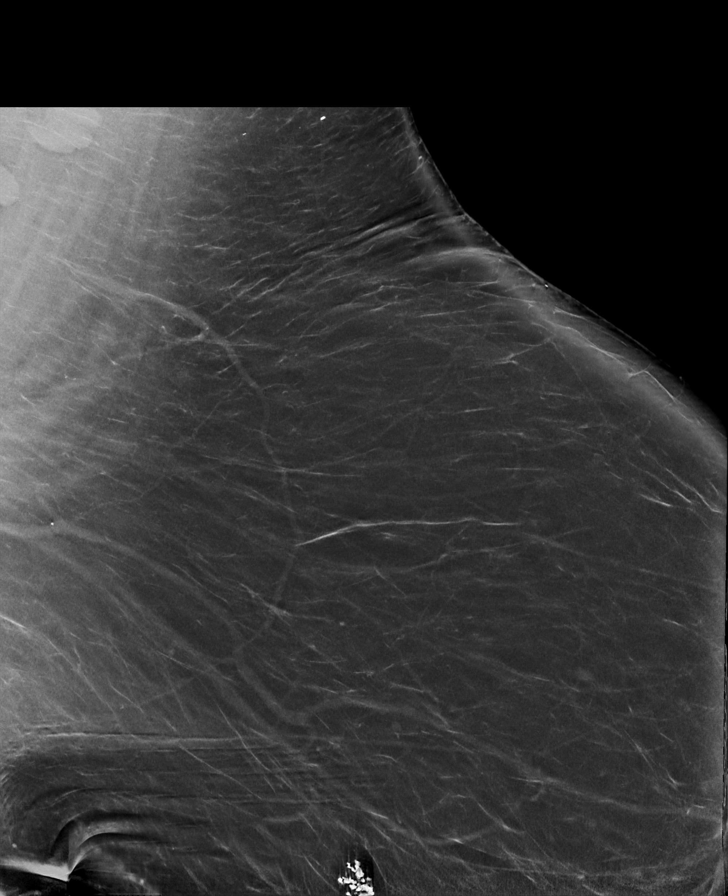

[R MLO synth-2D (2 of 2)]
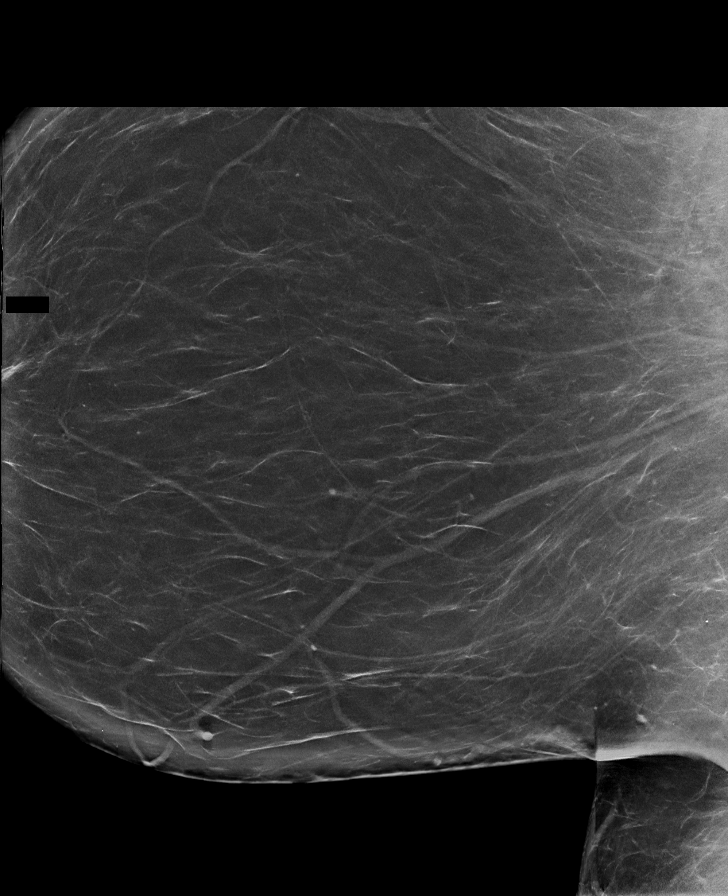

[L XCCL synth-2D]
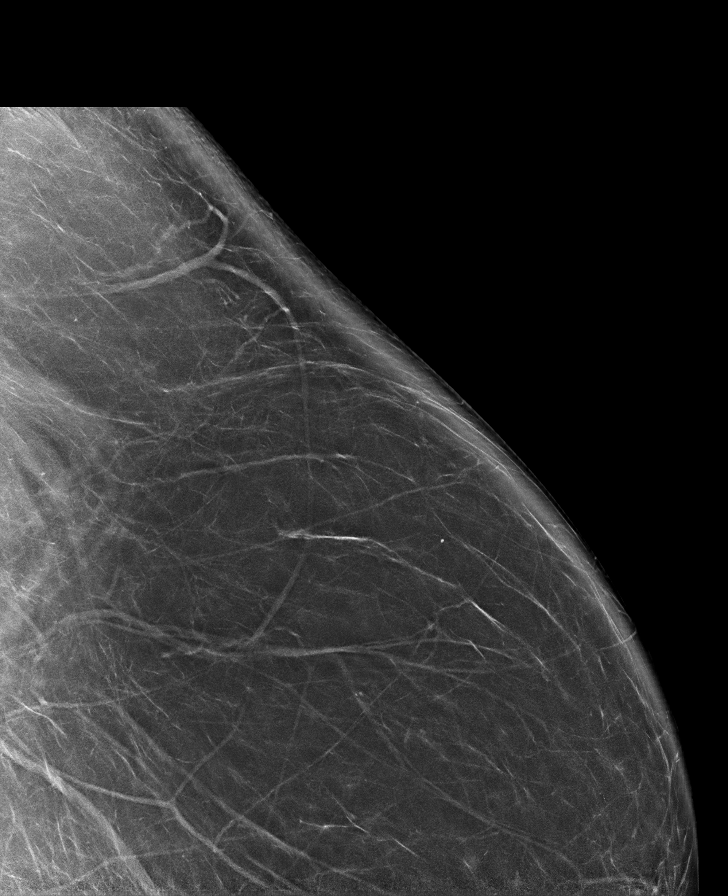

[L MLO synth-2D (2 of 2)]
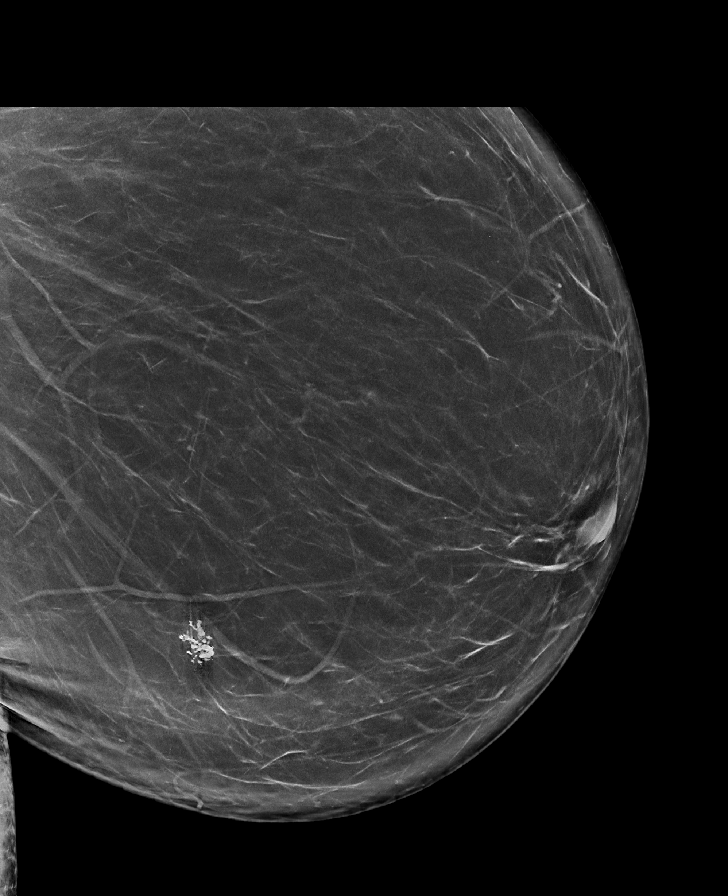

[R CC synth-2D]
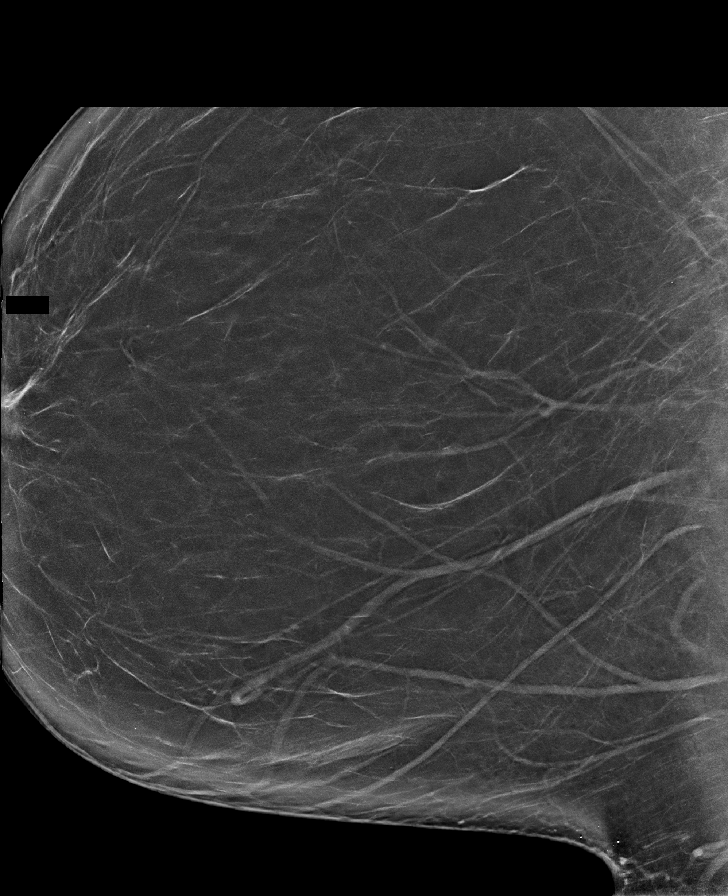

[R XCCL synth-2D]
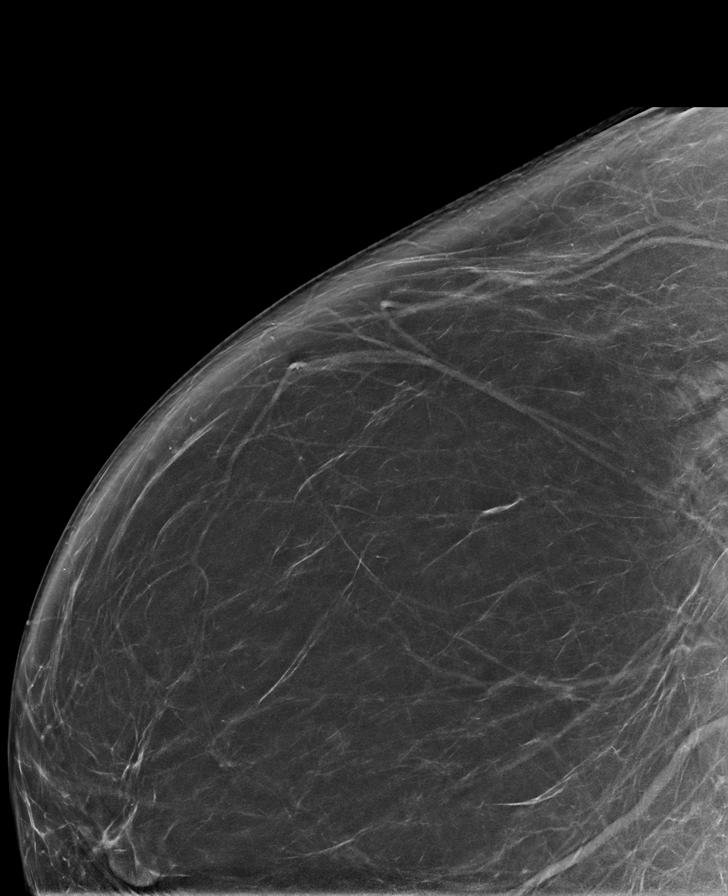

[8 of 40 positions shown; findings below may reference images not displayed]

ACR Breast Density Category b: There are scattered areas of
fibroglandular density.
FINDINGS: There are no findings suspicious for malignancy.
IMPRESSION: No mammographic evidence of malignancy. A result letter of this
screening mammogram will be mailed directly to the patient.

RECOMMENDATION:
Screening mammogram in one year. (Code:51-O-LD2)

BI-RADS CATEGORY  1: Negative.

## 2022-08-09 ENCOUNTER — Ambulatory Visit
Admission: RE | Admit: 2022-08-09 | Discharge: 2022-08-09 | Disposition: A | Discharge status: Home / Self Care | Payer: BC Managed Care – PPO | Source: Ambulatory Visit | Attending: Family Medicine | Admitting: Family Medicine

## 2022-08-09 DIAGNOSIS — Z1231 Encounter for screening mammogram for malignant neoplasm of breast: Secondary | ICD-10-CM

## 2023-08-04 ENCOUNTER — Other Ambulatory Visit: Payer: Self-pay | Admitting: Family Medicine

## 2023-08-04 DIAGNOSIS — Z1231 Encounter for screening mammogram for malignant neoplasm of breast: Secondary | ICD-10-CM

## 2023-08-30 ENCOUNTER — Ambulatory Visit: Payer: BC Managed Care – PPO

## 2023-09-13 ENCOUNTER — Ambulatory Visit
Admission: RE | Admit: 2023-09-13 | Discharge: 2023-09-13 | Disposition: A | Discharge status: Home / Self Care | Payer: BC Managed Care – PPO | Source: Ambulatory Visit | Attending: Family Medicine | Admitting: Family Medicine

## 2023-09-13 ENCOUNTER — Encounter: Payer: Self-pay | Admitting: Family Medicine

## 2023-09-13 DIAGNOSIS — Z1231 Encounter for screening mammogram for malignant neoplasm of breast: Secondary | ICD-10-CM

## 2024-09-05 ENCOUNTER — Other Ambulatory Visit: Payer: Self-pay | Admitting: Family Medicine

## 2024-09-05 DIAGNOSIS — Z1231 Encounter for screening mammogram for malignant neoplasm of breast: Secondary | ICD-10-CM

## 2024-10-02 ENCOUNTER — Ambulatory Visit
Admission: RE | Admit: 2024-10-02 | Discharge: 2024-10-02 | Disposition: A | Source: Ambulatory Visit | Attending: Family Medicine | Admitting: Family Medicine

## 2024-10-02 DIAGNOSIS — Z1231 Encounter for screening mammogram for malignant neoplasm of breast: Secondary | ICD-10-CM
